# Patient Record
Sex: Female | Born: 1950
Health system: Southern US, Community
[De-identification: ages and names within clinical notes are randomized; demographics above are authoritative.]

## PROBLEM LIST (undated history)

## (undated) DIAGNOSIS — C801 Malignant (primary) neoplasm, unspecified: Secondary | ICD-10-CM

## (undated) DIAGNOSIS — J45909 Unspecified asthma, uncomplicated: Secondary | ICD-10-CM

## (undated) DIAGNOSIS — I10 Essential (primary) hypertension: Secondary | ICD-10-CM

## (undated) DIAGNOSIS — E039 Hypothyroidism, unspecified: Secondary | ICD-10-CM

## (undated) DIAGNOSIS — Z201 Contact with and (suspected) exposure to tuberculosis: Secondary | ICD-10-CM

## (undated) DIAGNOSIS — R7303 Prediabetes: Secondary | ICD-10-CM

## (undated) DIAGNOSIS — J9811 Atelectasis: Secondary | ICD-10-CM

## (undated) DIAGNOSIS — Z9109 Other allergy status, other than to drugs and biological substances: Secondary | ICD-10-CM

## (undated) DIAGNOSIS — E785 Hyperlipidemia, unspecified: Secondary | ICD-10-CM

## (undated) DIAGNOSIS — G629 Polyneuropathy, unspecified: Secondary | ICD-10-CM

## (undated) DIAGNOSIS — Z8489 Family history of other specified conditions: Secondary | ICD-10-CM

## (undated) DIAGNOSIS — R0600 Dyspnea, unspecified: Secondary | ICD-10-CM

## (undated) DIAGNOSIS — D492 Neoplasm of unspecified behavior of bone, soft tissue, and skin: Secondary | ICD-10-CM

## (undated) DIAGNOSIS — K219 Gastro-esophageal reflux disease without esophagitis: Secondary | ICD-10-CM

## (undated) DIAGNOSIS — Z889 Allergy status to unspecified drugs, medicaments and biological substances status: Secondary | ICD-10-CM

## (undated) HISTORY — PX: COLONOSCOPY W/ POLYPECTOMY: SHX1380

---

## 1997-07-21 HISTORY — PX: BREAST BIOPSY: SHX20

## 2001-02-01 ENCOUNTER — Emergency Department (HOSPITAL_COMMUNITY): Admission: EM | Admit: 2001-02-01 | Discharge: 2001-02-02 | Payer: Self-pay | Admitting: Emergency Medicine

## 2001-04-13 ENCOUNTER — Encounter: Payer: Self-pay | Admitting: General Surgery

## 2001-04-13 ENCOUNTER — Encounter: Admission: RE | Admit: 2001-04-13 | Discharge: 2001-04-13 | Payer: Self-pay | Admitting: General Surgery

## 2002-12-27 ENCOUNTER — Encounter: Admission: RE | Admit: 2002-12-27 | Discharge: 2002-12-27 | Payer: Self-pay | Admitting: Obstetrics and Gynecology

## 2002-12-27 ENCOUNTER — Encounter: Payer: Self-pay | Admitting: Obstetrics and Gynecology

## 2004-06-11 ENCOUNTER — Ambulatory Visit: Payer: Self-pay | Admitting: Obstetrics and Gynecology

## 2004-06-27 ENCOUNTER — Ambulatory Visit: Payer: Self-pay | Admitting: Obstetrics and Gynecology

## 2004-07-21 HISTORY — PX: CARPAL TUNNEL RELEASE: SHX101

## 2004-09-23 ENCOUNTER — Ambulatory Visit: Payer: Self-pay | Admitting: Internal Medicine

## 2005-10-07 ENCOUNTER — Ambulatory Visit: Payer: Self-pay | Admitting: Internal Medicine

## 2006-09-17 ENCOUNTER — Ambulatory Visit: Payer: Self-pay | Admitting: Family Medicine

## 2007-08-24 ENCOUNTER — Ambulatory Visit: Payer: Self-pay | Admitting: Internal Medicine

## 2008-07-21 DIAGNOSIS — C801 Malignant (primary) neoplasm, unspecified: Secondary | ICD-10-CM

## 2008-07-21 HISTORY — PX: OTHER SURGICAL HISTORY: SHX169

## 2008-07-21 HISTORY — DX: Malignant (primary) neoplasm, unspecified: C80.1

## 2008-08-01 ENCOUNTER — Ambulatory Visit: Payer: Self-pay | Admitting: Unknown Physician Specialty

## 2008-08-07 ENCOUNTER — Ambulatory Visit: Payer: Self-pay | Admitting: Unknown Physician Specialty

## 2009-07-16 ENCOUNTER — Ambulatory Visit: Payer: Self-pay

## 2009-12-26 ENCOUNTER — Ambulatory Visit: Payer: Self-pay | Admitting: General Practice

## 2009-12-26 ENCOUNTER — Ambulatory Visit: Payer: Self-pay | Admitting: Internal Medicine

## 2011-02-18 ENCOUNTER — Ambulatory Visit: Payer: Self-pay | Admitting: Internal Medicine

## 2011-04-17 ENCOUNTER — Ambulatory Visit: Payer: Self-pay | Admitting: Gastroenterology

## 2012-02-24 ENCOUNTER — Ambulatory Visit: Payer: Self-pay | Admitting: Internal Medicine

## 2012-09-30 ENCOUNTER — Ambulatory Visit: Payer: Self-pay | Admitting: General Practice

## 2012-10-19 ENCOUNTER — Ambulatory Visit: Payer: Self-pay | Admitting: General Practice

## 2014-02-22 ENCOUNTER — Ambulatory Visit: Payer: Self-pay | Admitting: Family Medicine

## 2015-07-04 ENCOUNTER — Other Ambulatory Visit: Payer: Self-pay | Admitting: Family Medicine

## 2015-07-04 DIAGNOSIS — Z1231 Encounter for screening mammogram for malignant neoplasm of breast: Secondary | ICD-10-CM

## 2015-07-11 ENCOUNTER — Ambulatory Visit
Admission: RE | Admit: 2015-07-11 | Discharge: 2015-07-11 | Disposition: A | Discharge status: Home / Self Care | Payer: No Typology Code available for payment source | Source: Ambulatory Visit | Attending: Family Medicine | Admitting: Family Medicine

## 2015-07-11 DIAGNOSIS — Z1231 Encounter for screening mammogram for malignant neoplasm of breast: Secondary | ICD-10-CM | POA: Diagnosis present

## 2015-07-11 HISTORY — DX: Malignant (primary) neoplasm, unspecified: C80.1

## 2016-02-27 ENCOUNTER — Other Ambulatory Visit: Payer: Self-pay | Admitting: Internal Medicine

## 2016-02-27 ENCOUNTER — Telehealth: Payer: Self-pay | Admitting: General Surgery

## 2016-02-27 DIAGNOSIS — E041 Nontoxic single thyroid nodule: Secondary | ICD-10-CM

## 2016-02-27 NOTE — Telephone Encounter (Signed)
Left voice message for patient to call and schedule appointment for right chest lesion. Referred by [place]

## 2016-02-28 ENCOUNTER — Other Ambulatory Visit: Payer: Self-pay | Admitting: Internal Medicine

## 2016-02-28 DIAGNOSIS — E041 Nontoxic single thyroid nodule: Secondary | ICD-10-CM

## 2016-02-29 ENCOUNTER — Telehealth: Payer: Self-pay | Admitting: Surgery

## 2016-02-29 NOTE — Telephone Encounter (Signed)
Spoke to patient regarding scheduling an appointment for BX right chest lesion. Referred by [facility]. She will be seeing her pcp 03/03/16 and wants to see what he says. She will call us if she decides she wants an appointment.

## 2016-03-04 ENCOUNTER — Ambulatory Visit: Payer: Medicare HMO

## 2016-03-20 ENCOUNTER — Telehealth: Payer: Self-pay | Admitting: Surgery

## 2016-03-20 ENCOUNTER — Encounter: Payer: Self-pay | Admitting: Surgery

## 2016-03-20 NOTE — Telephone Encounter (Signed)
Left voice message for patient to see if she was ready to schedule an appointment after seeing her PCP. Sent letter.

## 2016-05-06 ENCOUNTER — Other Ambulatory Visit: Payer: Self-pay | Admitting: Internal Medicine

## 2016-05-06 DIAGNOSIS — Z1231 Encounter for screening mammogram for malignant neoplasm of breast: Secondary | ICD-10-CM

## 2016-07-11 ENCOUNTER — Ambulatory Visit: Payer: No Typology Code available for payment source

## 2016-07-16 ENCOUNTER — Other Ambulatory Visit: Payer: Self-pay | Admitting: Internal Medicine

## 2016-07-16 DIAGNOSIS — R945 Abnormal results of liver function studies: Secondary | ICD-10-CM

## 2016-07-17 ENCOUNTER — Ambulatory Visit
Admission: RE | Admit: 2016-07-17 | Discharge: 2016-07-17 | Disposition: A | Discharge status: Home / Self Care | Payer: Medicare HMO | Source: Ambulatory Visit | Attending: Internal Medicine | Admitting: Internal Medicine

## 2016-07-17 DIAGNOSIS — Z1231 Encounter for screening mammogram for malignant neoplasm of breast: Secondary | ICD-10-CM | POA: Diagnosis not present

## 2016-07-18 ENCOUNTER — Ambulatory Visit
Admission: RE | Admit: 2016-07-18 | Discharge: 2016-07-18 | Disposition: A | Discharge status: Home / Self Care | Payer: Medicare HMO | Source: Ambulatory Visit | Attending: Internal Medicine | Admitting: Internal Medicine

## 2016-07-18 DIAGNOSIS — K8020 Calculus of gallbladder without cholecystitis without obstruction: Secondary | ICD-10-CM | POA: Diagnosis not present

## 2016-07-18 DIAGNOSIS — R945 Abnormal results of liver function studies: Secondary | ICD-10-CM

## 2016-07-18 DIAGNOSIS — R748 Abnormal levels of other serum enzymes: Secondary | ICD-10-CM | POA: Insufficient documentation

## 2017-05-21 ENCOUNTER — Other Ambulatory Visit: Payer: Self-pay | Admitting: Nurse Practitioner

## 2017-05-21 DIAGNOSIS — Z1231 Encounter for screening mammogram for malignant neoplasm of breast: Secondary | ICD-10-CM

## 2017-07-20 ENCOUNTER — Ambulatory Visit
Admission: RE | Admit: 2017-07-20 | Discharge: 2017-07-20 | Disposition: A | Discharge status: Home / Self Care | Payer: Medicare HMO | Source: Ambulatory Visit | Attending: Nurse Practitioner | Admitting: Nurse Practitioner

## 2017-07-20 DIAGNOSIS — Z1231 Encounter for screening mammogram for malignant neoplasm of breast: Secondary | ICD-10-CM | POA: Diagnosis present

## 2018-01-15 ENCOUNTER — Other Ambulatory Visit: Payer: Self-pay

## 2018-01-15 ENCOUNTER — Encounter
Admission: RE | Admit: 2018-01-15 | Discharge: 2018-01-15 | Disposition: A | Discharge status: Home / Self Care | Payer: Medicare PPO | Source: Ambulatory Visit | Attending: Surgery | Admitting: Surgery

## 2018-01-15 DIAGNOSIS — Z01810 Encounter for preprocedural cardiovascular examination: Secondary | ICD-10-CM | POA: Insufficient documentation

## 2018-01-15 DIAGNOSIS — Z01812 Encounter for preprocedural laboratory examination: Secondary | ICD-10-CM | POA: Insufficient documentation

## 2018-01-15 DIAGNOSIS — I10 Essential (primary) hypertension: Secondary | ICD-10-CM | POA: Diagnosis not present

## 2018-01-15 DIAGNOSIS — R9431 Abnormal electrocardiogram [ECG] [EKG]: Secondary | ICD-10-CM | POA: Diagnosis not present

## 2018-01-15 DIAGNOSIS — I517 Cardiomegaly: Secondary | ICD-10-CM | POA: Diagnosis not present

## 2018-01-15 HISTORY — DX: Hypothyroidism, unspecified: E03.9

## 2018-01-15 HISTORY — DX: Other allergy status, other than to drugs and biological substances: Z91.09

## 2018-01-15 HISTORY — DX: Essential (primary) hypertension: I10

## 2018-01-15 HISTORY — DX: Allergy status to unspecified drugs, medicaments and biological substances: Z88.9

## 2018-01-15 HISTORY — DX: Polyneuropathy, unspecified: G62.9

## 2018-01-15 HISTORY — DX: Gastro-esophageal reflux disease without esophagitis: K21.9

## 2018-01-15 HISTORY — DX: Hyperlipidemia, unspecified: E78.5

## 2018-01-15 HISTORY — DX: Family history of other specified conditions: Z84.89

## 2018-01-15 LAB — BASIC METABOLIC PANEL
Anion gap: 9 (ref 5–15)
BUN: 9 mg/dL (ref 8–23)
CO2: 29 mmol/L (ref 22–32)
Calcium: 9.6 mg/dL (ref 8.9–10.3)
Chloride: 102 mmol/L (ref 98–111)
Creatinine, Ser: 0.66 mg/dL (ref 0.44–1.00)
GFR calc Af Amer: >60 mL/min (ref >60)
GFR calc non Af Amer: >60 mL/min (ref >60)
GLUCOSE: 94 mg/dL (ref 70–99)
POTASSIUM: 3.3 mmol/L — AB (ref 3.5–5.1)
Sodium: 140 mmol/L (ref 135–145)

## 2018-01-15 LAB — CBC
HEMATOCRIT: 43.4 % (ref 35.0–47.0)
Hemoglobin: 14.7 g/dL (ref 12.0–16.0)
MCH: 28.4 pg (ref 26.0–34.0)
MCHC: 33.9 g/dL (ref 32.0–36.0)
MCV: 83.8 fL (ref 80.0–100.0)
Platelets: 260 10*3/uL (ref 150–440)
RBC: 5.17 MIL/uL (ref 3.80–5.20)
RDW: 14.5 % (ref 11.5–14.5)
WBC: 8.1 10*3/uL (ref 3.6–11.0)

## 2018-01-15 NOTE — Patient Instructions (Addendum)
Your procedure is scheduled on: Friday 01/22/18 Report to [place]. To find out your arrival time please call [phone] between 1PM - 3PM on [facility] 01/20/18.  Remember: Instructions that are not followed completely may result in serious medical risk, up to and including death, or upon the discretion of your surgeon and anesthesiologist your surgery may need to be rescheduled.     _X__ 1. Do not eat food after midnight the night before your procedure.                 No gum chewing or hard candies. You may drink clear liquids up to 2 hours                 before you are scheduled to arrive for your surgery- DO not drink clear                 liquids within 2 hours of the start of your surgery.                 Clear Liquids include:  water, apple juice without pulp, clear carbohydrate                 drink such as Clearfast or Gatorade, Black Coffee or Tea (Do not add                 anything to coffee or tea).  __X__2.  On the morning of surgery brush your teeth with toothpaste and water, you                 may rinse your mouth with mouthwash if you wish.  Do not swallow any              toothpaste of mouthwash.     _X__ 3.  No Alcohol for 24 hours before or after surgery.   _X__ 4.  Do Not Smoke or use e-cigarettes For 24 Hours Prior to Your Surgery.                 Do not use any chewable tobacco products for at least 6 hours prior to                 surgery.  ____  5.  Bring all medications with you on the day of surgery if instructed.   __X__  6.  Notify your doctor if there is any change in your medical condition      (cold, fever, infections).     Do not wear jewelry, make-up, hairpins, clips or nail polish. Do not wear lotions, powders, or perfumes.  Do not shave 48 hours prior to surgery. Men may shave face and neck. Do not bring valuables to the hospital.    [facility] is not responsible for any belongings or  valuables.  Contacts, dentures/partials or body piercings may not be worn into surgery. Bring a case for your contacts, glasses or hearing aids, a denture cup will be supplied. Leave your suitcase in the car. After surgery it may be brought to your room. For patients admitted to the hospital, discharge time is determined by your treatment team.   Patients discharged the day of surgery will not be allowed to drive home.   Please read over the following fact sheets that you were given:   MRSA Information  __X__ Take these medicines the morning of surgery with A SIP OF WATER:  1. CARVEDILOL  2. LEVOCETIRIZINE  3. OMEPRAZOLE  4. LEVOTHYROXINE  5.  6.  ____ Fleet Enema (as directed)   __X__ Use CHG Soap/SAGE wipes as directed  ____ Use inhalers on the day of surgery  ____ Stop metformin/Janumet/Farxiga 2 days prior to surgery    ____ Take 1/2 of usual insulin dose the night before surgery. No insulin the morning          of surgery.   ____ Stop Blood Thinners Coumadin/Plavix/Xarelto/Pleta/Pradaxa/Eliquis/Effient/Aspirin  on   Or contact your Surgeon, Cardiologist or Medical Doctor regarding  ability to stop your blood thinners  __X__ Stop Anti-inflammatories 7 days before surgery such as Advil, Ibuprofen, Motrin,  BC or Goodies Powder, Naprosyn, Naproxen, Aleve, Aspirin OK TO TAKE TYLENOL   __X__ Stop all herbal supplements, fish oil or vitamin E until after surgery.    ____ Bring C-Pap to the hospital.

## 2018-01-15 NOTE — Pre-Procedure Instructions (Signed)
   ECG 12-lead8/05/2015 [place] Component Name Value Ref Range  Vent Rate (bpm) 78   PR Interval (msec) 164   QRS Interval (msec) 104   QT Interval (msec) 398   QTc (msec) 453   Result Narrative  Normal sinus rhythm Left atrial enlargement Left ventricular hypertrophy Nonspecific ST elevation Nonspecific T wave abnormalities, Inferolateral leads Abnormal ECG  When compared with ECG of 01-Mar-2015 04:01, No significant change was found I reviewed and concur with this report. Electronically signed XH:[id number], MD, DOUGLAS [phone]) on 03/01/2015 5:39:01 PM

## 2018-01-18 DIAGNOSIS — D492 Neoplasm of unspecified behavior of bone, soft tissue, and skin: Secondary | ICD-10-CM

## 2018-01-18 HISTORY — DX: Neoplasm of unspecified behavior of bone, soft tissue, and skin: D49.2

## 2018-01-18 HISTORY — PX: MULTIPLE TOOTH EXTRACTIONS: SHX2053

## 2018-01-18 NOTE — Pre-Procedure Instructions (Signed)
FAXED KT 3.3 TO PCP AND FYI TO SURGEON

## 2018-02-16 ENCOUNTER — Other Ambulatory Visit: Payer: Self-pay

## 2018-02-16 ENCOUNTER — Encounter: Payer: Self-pay | Admitting: *Deleted

## 2018-02-16 ENCOUNTER — Encounter
Admission: RE | Admit: 2018-02-16 | Discharge: 2018-02-16 | Disposition: A | Discharge status: Home / Self Care | Payer: Medicare PPO | Source: Ambulatory Visit | Attending: Surgery | Admitting: Surgery

## 2018-02-16 NOTE — Pre-Procedure Instructions (Signed)
Called the office of Dr. Ceasar Mons to request results of Echo, stress test and ABI performed earlier this month. Unable to get fax through (rings as busy).  Office stated that they would have to wait to receive a fax before they can send to [facility]

## 2018-02-17 NOTE — Pre-Procedure Instructions (Signed)
Cardiac clearance/stress/echo on chart

## 2018-02-25 NOTE — H&P (Signed)
Ashlee Miller Location: [place] Office Patient #: [id number] DOB: 15-Jun-1951 Married / Language: English / Race: Black or African American Female   History of Present Illness  The patient is a 67 year old female who presents with a complaint of Mass. She has developed this mass over her right clavicle over the last several years. It is getting larger in size. She would like to have it removed. Initially she was reluctant to consider [place] since she had a sister who died before she had surgery probably from an anesthetic complication. I reassured her and I think it would be fine to do her under general anesthesia.  I explained how we would do this. It may be that we want to try to inject this with Kenalog to try to cut down on the potential for a keloid. We will go ahead and schedule this under general anesthesia.   Past Surgical History  Oral Surgery   Diagnostic Studies History  Colonoscopy  1-5 years ago Mammogram  within last year Pap Smear  1-5 years ago  Allergies ( ACE Inhibitors  Penicillins  Fenofibrate *ANTIHYPERLIPIDEMICS*   Medication History  Klor-Con M20 (20MEQ Tablet ER, Oral) Active. Atorvastatin Calcium (80MG  Tablet, Oral) Active. Carvedilol (12.5MG  Tablet, Oral) Active. Gabapentin (100MG  Capsule, Oral) Active. HydroCHLOROthiazide (25MG  Tablet, Oral) Active. Losartan Potassium (100MG  Tablet, Oral) Active. Omeprazole (40MG  Capsule DR, Oral) Active. Montelukast Sodium (10MG  Tablet, Oral) Active. Allegra Allergy (180MG  Tablet, Oral) Active. Flonase Allergy Relief (50MCG/ACT Suspension, Nasal) Active. Aspirin (81MG  Tablet, Oral) Active. Advair Diskus (250-50MCG/DOSE Aero Pow Br Act, Inhalation) Active. Multivitamin (Oral) Active. ProAir HFA (108 (90 Base)MCG/ACT Aerosol Soln, Inhalation) Active. Medications Reconciled  Social History  Caffeine use  Coffee, Tea. No alcohol use  No drug use  Tobacco  use  Former smoker.  Family History Anesthetic complications  Mother, Sister. Arthritis  Mother. Colon Polyps  Brother. Diabetes Mellitus  Brother, Mother, Sister. Heart Disease  Father. Hypertension  Brother, Mother, Sister.  Pregnancy / Birth History  Age at menarche  27 years. Age of menopause  28-55 Gravida  0  Other Problems  High blood pressure  Hypercholesterolemia  Thyroid Disease     Review of Systems General Not Present- Appetite Loss, Chills, Fatigue, Fever, Night Sweats, Weight Gain and Weight Loss. Skin Present- Hives. Not Present- Change in Wart/Mole, Dryness, Jaundice, New Lesions, Non-Healing Wounds, Rash and Ulcer. HEENT Present- Seasonal Allergies. Not Present- Earache, Hearing Loss, Hoarseness, Nose Bleed, Oral Ulcers, Ringing in the Ears, Sinus Pain, Sore Throat, Visual Disturbances, Wears glasses/contact lenses and Yellow Eyes. Respiratory Not Present- Bloody sputum, Chronic Cough, Difficulty Breathing, Snoring and Wheezing. Breast Not Present- Breast Mass, Breast Pain, Nipple Discharge and Skin Changes. Cardiovascular Present- Swelling of Extremities. Not Present- Chest Pain, Difficulty Breathing Lying Down, Leg Cramps, Palpitations, Rapid Heart Rate and Shortness of Breath. Gastrointestinal Not Present- Abdominal Pain, Bloating, Bloody Stool, Change in Bowel Habits, Chronic diarrhea, Constipation, Difficulty Swallowing, Excessive gas, Gets full quickly at meals, Hemorrhoids, Indigestion, Nausea, Rectal Pain and Vomiting. Female Genitourinary Not Present- Frequency, Nocturia, Painful Urination, Pelvic Pain and Urgency. Musculoskeletal Not Present- Back Pain, Joint Pain, Joint Stiffness, Muscle Pain, Muscle Weakness and Swelling of Extremities. Neurological Present- Trouble walking. Not Present- Decreased Memory, Fainting, Headaches, Numbness, Seizures, Tingling, Tremor and Weakness. Psychiatric Not Present- Anxiety, Bipolar, Change in  Sleep Pattern, Depression, Fearful and Frequent crying. Endocrine Present- Cold Intolerance. Not Present- Excessive Hunger, Hair Changes, Heat Intolerance, Hot flashes and New Diabetes. Hematology Not Present- Blood Thinners, Easy Bruising,  Excessive bleeding, Gland problems, HIV and Persistent Infections.   Physical Exam  WDWNAAF NAD HEENT unremarkable Heart SR Chest and Lung Exam Note: at the level of the right clavicle there is a soft freely movable mass consistent with a lipoma. 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   Matt B. Hassell Done, MD, FACS

## 2018-02-26 ENCOUNTER — Ambulatory Visit: Payer: Medicare PPO | Admitting: Anesthesiology

## 2018-02-26 ENCOUNTER — Other Ambulatory Visit: Payer: Self-pay

## 2018-02-26 ENCOUNTER — Ambulatory Visit
Admission: RE | Admit: 2018-02-26 | Discharge: 2018-02-26 | Disposition: A | Discharge status: Home / Self Care | Payer: Medicare PPO | Source: Ambulatory Visit | Attending: Surgery | Admitting: Surgery

## 2018-02-26 ENCOUNTER — Encounter
Admission: RE | Disposition: A | Discharge status: Home / Self Care | Payer: Self-pay | Source: Ambulatory Visit | Attending: Surgery

## 2018-02-26 ENCOUNTER — Encounter: Payer: Self-pay | Admitting: Anesthesiology

## 2018-02-26 DIAGNOSIS — Z888 Allergy status to other drugs, medicaments and biological substances status: Secondary | ICD-10-CM | POA: Insufficient documentation

## 2018-02-26 DIAGNOSIS — Z7982 Long term (current) use of aspirin: Secondary | ICD-10-CM | POA: Diagnosis not present

## 2018-02-26 DIAGNOSIS — Z880 Allergy status to penicillin: Secondary | ICD-10-CM | POA: Insufficient documentation

## 2018-02-26 DIAGNOSIS — Z87891 Personal history of nicotine dependence: Secondary | ICD-10-CM | POA: Insufficient documentation

## 2018-02-26 DIAGNOSIS — D171 Benign lipomatous neoplasm of skin and subcutaneous tissue of trunk: Secondary | ICD-10-CM | POA: Diagnosis not present

## 2018-02-26 DIAGNOSIS — Z79890 Hormone replacement therapy (postmenopausal): Secondary | ICD-10-CM | POA: Diagnosis not present

## 2018-02-26 DIAGNOSIS — Z79899 Other long term (current) drug therapy: Secondary | ICD-10-CM | POA: Insufficient documentation

## 2018-02-26 DIAGNOSIS — E039 Hypothyroidism, unspecified: Secondary | ICD-10-CM | POA: Diagnosis not present

## 2018-02-26 DIAGNOSIS — I10 Essential (primary) hypertension: Secondary | ICD-10-CM | POA: Diagnosis not present

## 2018-02-26 DIAGNOSIS — K219 Gastro-esophageal reflux disease without esophagitis: Secondary | ICD-10-CM | POA: Diagnosis not present

## 2018-02-26 DIAGNOSIS — J45909 Unspecified asthma, uncomplicated: Secondary | ICD-10-CM | POA: Diagnosis not present

## 2018-02-26 HISTORY — PX: MASS EXCISION: SHX2000

## 2018-02-26 HISTORY — DX: Unspecified asthma, uncomplicated: J45.909

## 2018-02-26 HISTORY — DX: Neoplasm of unspecified behavior of bone, soft tissue, and skin: D49.2

## 2018-02-26 SURGERY — EXCISION MASS
Anesthesia: General | Laterality: Right | Wound class: Clean

## 2018-02-26 MED ORDER — CHLORHEXIDINE GLUCONATE CLOTH 2 % EX PADS: 6.0000 | MEDICATED_PAD | Freq: Once | CUTANEOUS | Status: DC

## 2018-02-26 MED ORDER — ONDANSETRON HCL 4 MG/2ML IJ SOLN
INTRAMUSCULAR | Status: AC
  Filled 2018-02-26: qty 2

## 2018-02-26 MED ORDER — FENTANYL CITRATE (PF) 100 MCG/2ML IJ SOLN: 25.0000 ug | INTRAMUSCULAR | Status: DC | PRN

## 2018-02-26 MED ORDER — PROPOFOL 10 MG/ML IV BOLUS
INTRAVENOUS | Status: DC | PRN
  Administered 2018-02-26: 160 mg via INTRAVENOUS

## 2018-02-26 MED ORDER — DEXAMETHASONE SODIUM PHOSPHATE 10 MG/ML IJ SOLN
INTRAMUSCULAR | Status: DC | PRN
  Administered 2018-02-26: 10 mg via INTRAVENOUS

## 2018-02-26 MED ORDER — DEXAMETHASONE SODIUM PHOSPHATE 10 MG/ML IJ SOLN
INTRAMUSCULAR | Status: AC
  Filled 2018-02-26: qty 1

## 2018-02-26 MED ORDER — SUGAMMADEX SODIUM 200 MG/2ML IV SOLN
INTRAVENOUS | Status: DC | PRN
  Administered 2018-02-26: 200 mg via INTRAVENOUS

## 2018-02-26 MED ORDER — HEPARIN SODIUM (PORCINE) 5000 UNIT/ML IJ SOLN
5000.0000 [IU] | Freq: Once | INTRAMUSCULAR | Status: AC
  Administered 2018-02-26: 5000 [IU] via SUBCUTANEOUS

## 2018-02-26 MED ORDER — ROCURONIUM BROMIDE 50 MG/5ML IV SOLN
INTRAVENOUS | Status: AC
  Filled 2018-02-26: qty 1

## 2018-02-26 MED ORDER — LACTATED RINGERS IV SOLN
INTRAVENOUS | Status: DC
  Administered 2018-02-26: 09:00:00 via INTRAVENOUS

## 2018-02-26 MED ORDER — ONDANSETRON HCL 4 MG/2ML IJ SOLN: 4.0000 mg | Freq: Once | INTRAMUSCULAR | Status: DC | PRN

## 2018-02-26 MED ORDER — FENTANYL CITRATE (PF) 100 MCG/2ML IJ SOLN
INTRAMUSCULAR | Status: DC | PRN
  Administered 2018-02-26 (×2): 50 ug via INTRAVENOUS

## 2018-02-26 MED ORDER — CELECOXIB 200 MG PO CAPS
ORAL_CAPSULE | ORAL | Status: AC
  Administered 2018-02-26: 200 mg via ORAL
  Filled 2018-02-26: qty 1

## 2018-02-26 MED ORDER — FENTANYL CITRATE (PF) 100 MCG/2ML IJ SOLN
INTRAMUSCULAR | Status: AC
  Filled 2018-02-26: qty 2

## 2018-02-26 MED ORDER — HEPARIN SODIUM (PORCINE) 5000 UNIT/ML IJ SOLN
INTRAMUSCULAR | Status: AC
  Administered 2018-02-26: 5000 [IU] via SUBCUTANEOUS
  Filled 2018-02-26: qty 1

## 2018-02-26 MED ORDER — PHENYLEPHRINE HCL 10 MG/ML IJ SOLN
INTRAMUSCULAR | Status: DC | PRN
  Administered 2018-02-26: 100 ug via INTRAVENOUS
  Administered 2018-02-26: 150 ug via INTRAVENOUS
  Administered 2018-02-26: 100 ug via INTRAVENOUS

## 2018-02-26 MED ORDER — EPHEDRINE SULFATE 50 MG/ML IJ SOLN
INTRAMUSCULAR | Status: DC | PRN
  Administered 2018-02-26: 20 mg via INTRAVENOUS

## 2018-02-26 MED ORDER — ACETAMINOPHEN 500 MG PO TABS
ORAL_TABLET | ORAL | Status: AC
  Administered 2018-02-26: 1000 mg via ORAL
  Filled 2018-02-26: qty 2

## 2018-02-26 MED ORDER — GABAPENTIN 300 MG PO CAPS
300.0000 mg | ORAL_CAPSULE | ORAL | Status: AC
  Administered 2018-02-26: 300 mg via ORAL

## 2018-02-26 MED ORDER — ROCURONIUM BROMIDE 100 MG/10ML IV SOLN
INTRAVENOUS | Status: DC | PRN
  Administered 2018-02-26: 10 mg via INTRAVENOUS

## 2018-02-26 MED ORDER — TRIAMCINOLONE ACETONIDE 40 MG/ML IJ SUSP
INTRAMUSCULAR | Status: DC | PRN
  Administered 2018-02-26: 40 mg

## 2018-02-26 MED ORDER — CELECOXIB 200 MG PO CAPS
200.0000 mg | ORAL_CAPSULE | ORAL | Status: AC
  Administered 2018-02-26: 200 mg via ORAL

## 2018-02-26 MED ORDER — SUCCINYLCHOLINE CHLORIDE 20 MG/ML IJ SOLN
INTRAMUSCULAR | Status: AC
  Filled 2018-02-26: qty 1

## 2018-02-26 MED ORDER — GABAPENTIN 300 MG PO CAPS
ORAL_CAPSULE | ORAL | Status: AC
  Administered 2018-02-26: 300 mg via ORAL
  Filled 2018-02-26: qty 1

## 2018-02-26 MED ORDER — SUCCINYLCHOLINE CHLORIDE 20 MG/ML IJ SOLN
INTRAMUSCULAR | Status: DC | PRN
  Administered 2018-02-26: 120 mg via INTRAVENOUS

## 2018-02-26 MED ORDER — BUPIVACAINE-EPINEPHRINE (PF) 0.5% -1:200000 IJ SOLN
INTRAMUSCULAR | Status: AC
  Filled 2018-02-26: qty 30

## 2018-02-26 MED ORDER — ONDANSETRON HCL 4 MG/2ML IJ SOLN
INTRAMUSCULAR | Status: DC | PRN
  Administered 2018-02-26: 4 mg via INTRAVENOUS

## 2018-02-26 MED ORDER — MIDAZOLAM HCL 2 MG/2ML IJ SOLN
INTRAMUSCULAR | Status: DC | PRN
  Administered 2018-02-26: 2 mg via INTRAVENOUS

## 2018-02-26 MED ORDER — BUPIVACAINE LIPOSOME 1.3 % IJ SUSP
20.0000 mL | Freq: Once | INTRAMUSCULAR | Status: DC
  Filled 2018-02-26: qty 20

## 2018-02-26 MED ORDER — PROPOFOL 10 MG/ML IV BOLUS
INTRAVENOUS | Status: AC
  Filled 2018-02-26: qty 20

## 2018-02-26 MED ORDER — SUGAMMADEX SODIUM 200 MG/2ML IV SOLN
INTRAVENOUS | Status: AC
  Filled 2018-02-26: qty 2

## 2018-02-26 MED ORDER — ACETAMINOPHEN 500 MG PO TABS
1000.0000 mg | ORAL_TABLET | ORAL | Status: AC
  Administered 2018-02-26: 1000 mg via ORAL

## 2018-02-26 MED ORDER — MIDAZOLAM HCL 2 MG/2ML IJ SOLN
INTRAMUSCULAR | Status: AC
  Filled 2018-02-26: qty 2

## 2018-02-26 MED ORDER — TRIAMCINOLONE ACETONIDE 40 MG/ML IJ SUSP
INTRAMUSCULAR | Status: AC
  Filled 2018-02-26: qty 1

## 2018-02-26 MED ORDER — CEFAZOLIN SODIUM-DEXTROSE 2-4 GM/100ML-% IV SOLN
2.0000 g | INTRAVENOUS | Status: AC
  Administered 2018-02-26: 2 g via INTRAVENOUS

## 2018-02-26 MED ORDER — BUPIVACAINE LIPOSOME 1.3 % IJ SUSP
INTRAMUSCULAR | Status: AC
  Filled 2018-02-26: qty 20

## 2018-02-26 MED ORDER — LIDOCAINE HCL (CARDIAC) PF 100 MG/5ML IV SOSY
PREFILLED_SYRINGE | INTRAVENOUS | Status: DC | PRN
  Administered 2018-02-26: 100 mg via INTRAVENOUS

## 2018-02-26 MED ORDER — BUPIVACAINE-EPINEPHRINE 0.5% -1:200000 IJ SOLN
INTRAMUSCULAR | Status: DC | PRN
  Administered 2018-02-26: 19 mL

## 2018-02-26 MED ORDER — HYDROCODONE-ACETAMINOPHEN 5-325 MG PO TABS: 1.0000 | ORAL_TABLET | Freq: Four times a day (QID) | ORAL | 0 refills | Status: DC | PRN

## 2018-02-26 MED ORDER — LIDOCAINE HCL (PF) 2 % IJ SOLN
INTRAMUSCULAR | Status: AC
  Filled 2018-02-26: qty 10

## 2018-02-26 MED ORDER — CEFAZOLIN SODIUM-DEXTROSE 2-4 GM/100ML-% IV SOLN
INTRAVENOUS | Status: AC
  Filled 2018-02-26: qty 100

## 2018-02-26 SURGICAL SUPPLY — 16 items
CANISTER SUCT 1200ML W/VALVE (MISCELLANEOUS) ×3 IMPLANT
DERMABOND ADVANCED (GAUZE/BANDAGES/DRESSINGS) ×2
DERMABOND ADVANCED .7 DNX12 (GAUZE/BANDAGES/DRESSINGS) ×1 IMPLANT
DRAPE LAPAROTOMY 77X122 PED (DRAPES) ×3 IMPLANT
DRSG GAUZE FLUFF 36X18 (GAUZE/BANDAGES/DRESSINGS) ×3 IMPLANT
ELECT REM PT RETURN 9FT ADLT (ELECTROSURGICAL) ×3
ELECTRODE REM PT RTRN 9FT ADLT (ELECTROSURGICAL) ×1 IMPLANT
GLOVE BIO SURGEON STRL SZ8 (GLOVE) ×3 IMPLANT
GOWN STRL REUS W/ TWL LRG LVL3 (GOWN DISPOSABLE) ×2 IMPLANT
GOWN STRL REUS W/TWL LRG LVL3 (GOWN DISPOSABLE) ×4
KIT TURNOVER KIT A (KITS) ×3 IMPLANT
NEEDLE HYPO 25GX1X1/2 BEV (NEEDLE) ×3 IMPLANT
SUT MNCRL 3 0 RB1 (SUTURE) ×1 IMPLANT
SUT MONOCRYL 3 0 RB1 (SUTURE) ×2
SUT VIC AB PLUS 45CM 1-MO-4 (SUTURE) ×3 IMPLANT
SYR BULB IRRIG 60ML STRL (SYRINGE) ×3 IMPLANT

## 2018-02-26 NOTE — Anesthesia Post-op Follow-up Note (Signed)
Anesthesia QCDR form completed.        

## 2018-02-26 NOTE — Discharge Instructions (Addendum)
Lipoma Removal, Care After Refer to this sheet in the next few weeks. These instructions provide you with information about caring for yourself after your procedure. Your health care provider may also give you more specific instructions. Your treatment has been planned according to current medical practices, but problems sometimes occur. Call your health care provider if you have any problems or questions after your procedure. What can I expect after the procedure? After the procedure, it is common to have:  Mild pain.  Swelling.  Bruising.  Follow these instructions at home:  Bathing You may shower tomorrow  Incision care   Follow instructions from your health care provider about how to take care of your incision. Make sure you: ? Wash your hands with soap and water before you change your bandage (dressing). If soap and water are not available, use hand sanitizer. ? Change your dressing as told by your health care provider. ? Leave stitches (sutures), skin glue, or adhesive strips in place. These skin closures may need to stay in place for 2 weeks or longer. If adhesive strip edges start to loosen and curl up, you may trim the loose edges. Do not remove adhesive strips completely unless your health care provider tells you to do that.  Check your incision area every day for signs of infection. Check for: ? More redness, swelling, or pain. ? Fluid or blood. ? Warmth. ? Pus or a bad smell. Driving  Do not drive or operate heavy machinery while taking prescription pain medicine.  Do not drive for 24 hours if you received a medicine to help you relax (sedative) during your procedure.  Ask your health care provider when it is safe for you to drive. General instructions  Take over-the-counter and prescription medicines only as told by your health care provider.  Do not use any tobacco products, such as cigarettes, chewing tobacco, and e-cigarettes. These can delay healing. If you  need help quitting, ask your health care provider.  Return to your normal activities as told by your health care provider. Ask your health care provider what activities are safe for you.  Keep all follow-up visits as told by your health care provider. This is important. Contact a health care provider if:  You have more redness, swelling, or pain around your incision.  You have fluid or blood coming from your incision.  Your incision feels warm to the touch.  You have pus or a bad smell coming from your incision.  You have pain that does not get better with medicine. Get help right away if:  You have chills or a fever.  You have severe pain. This information is not intended to replace advice given to you by your health care provider. Make sure you discuss any questions you have with your health care provider. Document Released: 09/20/2015 Document Revised: 12/18/2015 Document Reviewed: 09/20/2015 Elsevier Interactive Patient Education  2018 [place]   1) The drugs that you were given will stay in your system until tomorrow so for the next 24 hours you should not:  A) Drive an automobile B) Make any legal decisions C) Drink any alcoholic beverage   2) You may resume regular meals tomorrow.  Today it is better to start with liquids and gradually work up to solid foods.  You may eat anything you prefer, but it is better to start with liquids, then soup and crackers, and gradually work up to solid foods.  3) Please notify your doctor immediately if you have any unusual bleeding, trouble breathing, redness and pain at the surgery site, drainage, fever, or pain not relieved by medication.    4) Additional Instructions:        Please contact your physician with any problems or Same Day Surgery at [phone], Monday through Friday 6 am to 4 pm, or [place] at [facility] number at [phone].

## 2018-02-26 NOTE — Interval H&P Note (Signed)
History and Physical Interval Note:  02/26/2018 10:13 AM  Ashlee Miller  has presented today for surgery, with the diagnosis of Probable Lipoma  The various methods of treatment have been discussed with the patient and family. After consideration of risks, benefits and other options for treatment, the patient has consented to  right clavicle mass removal as a surgical intervention .  The patient's history has been reviewed, patient examined, no change in status, stable for surgery.  I have reviewed the patient's chart and labs.  Questions were answered to the patient's satisfaction.     Pedro Earls

## 2018-02-26 NOTE — Transfer of Care (Signed)
Immediate Anesthesia Transfer of Care Note  Patient: Ashlee Miller  Procedure(s) Performed: EXCISION OF MASS OF RIGHT CLAVICLE AREA (Right )  Patient Location: PACU  Anesthesia Type:General  Level of Consciousness: drowsy  Airway & Oxygen Therapy: Patient Spontanous Breathing and Patient connected to face mask oxygen  Post-op Assessment: Report given to RN and Post -op Vital signs reviewed and stable  Post vital signs: Reviewed and stable  Last Vitals:  Vitals Value Taken Time  BP 114/60 02/26/2018 11:19 AM  Temp 36.4 C 02/26/2018 11:19 AM  Pulse 75 02/26/2018 11:20 AM  Resp 19 02/26/2018 11:20 AM  SpO2 99 % 02/26/2018 11:20 AM  Vitals shown include unvalidated device data.  Last Pain:  Vitals:   02/26/18 1119  TempSrc:   PainSc: Asleep      Patients Stated Pain Goal: 1 (43/27/61 4709)  Complications: No apparent anesthesia complications

## 2018-02-26 NOTE — Anesthesia Preprocedure Evaluation (Signed)
Anesthesia Evaluation  Patient identified by MRN, date of birth, ID band Patient awake    Reviewed: Allergy & Precautions, NPO status , Patient's Chart, lab work & pertinent test results  History of Anesthesia Complications (+) Family history of anesthesia reaction  Airway Mallampati: III       Dental   Pulmonary asthma , former smoker,    Pulmonary exam normal        Cardiovascular hypertension, Normal cardiovascular exam     Neuro/Psych negative neurological ROS  negative psych ROS   GI/Hepatic Neg liver ROS, GERD  ,  Endo/Other  Hypothyroidism   Renal/GU   negative genitourinary   Musculoskeletal negative musculoskeletal ROS (+)   Abdominal Normal abdominal exam  (+)   Peds negative pediatric ROS (+)  Hematology negative hematology ROS (+)   Anesthesia Other Findings   Reproductive/Obstetrics                             Anesthesia Physical Anesthesia Plan  ASA: II  Anesthesia Plan: General   Post-op Pain Management:    Induction: Intravenous  PONV Risk Score and Plan:   Airway Management Planned: Oral ETT  Additional Equipment:   Intra-op Plan:   Post-operative Plan: Extubation in OR  Informed Consent: I have reviewed the patients History and Physical, chart, labs and discussed the procedure including the risks, benefits and alternatives for the proposed anesthesia with the patient or authorized representative who has indicated his/her understanding and acceptance.   Dental advisory given  Plan Discussed with: CRNA and Surgeon  Anesthesia Plan Comments:         Anesthesia Quick Evaluation

## 2018-02-26 NOTE — Op Note (Signed)
Ashlee Miller  25-Aug-1950 February 26, 2018    PCP:  Danelle Berry, NP   Surgeon: Kaylyn Lim, MD, FACS  Asst:  none  Anes:  General by LMA  Preop Dx: 5 cm lipoma of the right clavicle  Postop Dx: same  Procedure: Excision of lipoma of the right clavicle area Location Surgery: [facility] #3 Complications: none  EBL:   minimal cc  Drains: none  Description of Procedure:  The patient was taken to OR 3 .  After anesthesia was administered and the patient was prepped  with Cholroprep and a timeout was performed.  The mass on and below the right clavicle had been marked and was very visible.  Proposed incision was infiltrated with 0.5% marcaine.  The incision was made and was taken down in to the fatty mass.  It was very defined and was dissected out en toto and removed.   Kenalog was infiltrated into the skin.  Minimal bleeding controlled with Bovie.  Closure with 4-0 vicryl and 5-0 Monocryl.  Closure with Dermabond.    The patient tolerated the procedure well and was taken to the PACU in stable condition.     Matt B. Hassell Done, [place], [facility], [place]

## 2018-02-26 NOTE — Anesthesia Procedure Notes (Signed)
Procedure Name: Intubation Date/Time: 02/26/2018 10:32 AM Performed by: Eben Burow, CRNA Pre-anesthesia Checklist: Patient identified, Emergency Drugs available, Suction available, Patient being monitored and Timeout performed Patient Re-evaluated:Patient Re-evaluated prior to induction Oxygen Delivery Method: Circle system utilized Preoxygenation: Pre-oxygenation with 100% oxygen Induction Type: IV induction Ventilation: Mask ventilation without difficulty Laryngoscope Size: Mac and 3 Grade View: Grade I Tube type: Oral Tube size: 7.0 mm Number of attempts: 1 Airway Equipment and Method: Stylet and LTA kit utilized Placement Confirmation: ETT inserted through vocal cords under direct vision,  positive ETCO2 and breath sounds checked- equal and bilateral Secured at: 21 cm Tube secured with: Tape Dental Injury: Teeth and Oropharynx as per pre-operative assessment

## 2018-03-01 NOTE — Anesthesia Postprocedure Evaluation (Signed)
Anesthesia Post Note  Patient: Ashlee Miller  Procedure(s) Performed: EXCISION OF MASS OF RIGHT CLAVICLE AREA (Right )  Patient location during evaluation: PACU Anesthesia Type: General Level of consciousness: awake and alert and oriented Pain management: pain level controlled Vital Signs Assessment: post-procedure vital signs reviewed and stable Respiratory status: spontaneous breathing Cardiovascular status: blood pressure returned to baseline Anesthetic complications: no     Last Vitals:  Vitals:   02/26/18 1204 02/26/18 1209  BP: 133/80 135/71  Pulse: 77 77  Resp: 18 20  Temp: (!) 36.3 C 36.6 C  SpO2: 97% 97%    Last Pain:  Vitals:   03/01/18 0813  TempSrc:   PainSc: 0-No pain                 [address]

## 2018-03-02 LAB — SURGICAL PATHOLOGY

## 2018-03-09 LAB — POCT I-STAT 4, (NA,K, GLUC, HGB,HCT)
GLUCOSE: 100 mg/dL — AB (ref 70–99)
HEMATOCRIT: 41 % (ref 36.0–46.0)
Hemoglobin: 13.9 g/dL (ref 12.0–15.0)
Potassium: 3.3 mmol/L — ABNORMAL LOW (ref 3.5–5.1)
SODIUM: 141 mmol/L (ref 135–145)

## 2018-07-19 ENCOUNTER — Other Ambulatory Visit: Payer: Self-pay | Admitting: Nurse Practitioner

## 2018-07-19 DIAGNOSIS — Z1231 Encounter for screening mammogram for malignant neoplasm of breast: Secondary | ICD-10-CM

## 2018-08-06 ENCOUNTER — Ambulatory Visit
Admission: RE | Admit: 2018-08-06 | Discharge: 2018-08-06 | Disposition: A | Discharge status: Home / Self Care | Payer: Medicare PPO | Source: Ambulatory Visit | Attending: Nurse Practitioner | Admitting: Nurse Practitioner

## 2018-08-06 DIAGNOSIS — Z1231 Encounter for screening mammogram for malignant neoplasm of breast: Secondary | ICD-10-CM | POA: Insufficient documentation

## 2019-08-31 ENCOUNTER — Other Ambulatory Visit: Payer: Self-pay | Admitting: Nurse Practitioner

## 2019-08-31 DIAGNOSIS — Z1231 Encounter for screening mammogram for malignant neoplasm of breast: Secondary | ICD-10-CM

## 2019-09-27 ENCOUNTER — Ambulatory Visit
Admission: RE | Admit: 2019-09-27 | Discharge: 2019-09-27 | Disposition: A | Discharge status: Home / Self Care | Payer: Medicare PPO | Source: Ambulatory Visit | Attending: Nurse Practitioner | Admitting: Nurse Practitioner

## 2019-09-27 DIAGNOSIS — Z1231 Encounter for screening mammogram for malignant neoplasm of breast: Secondary | ICD-10-CM | POA: Insufficient documentation

## 2020-09-18 ENCOUNTER — Other Ambulatory Visit: Payer: Self-pay | Admitting: Nurse Practitioner

## 2020-09-18 DIAGNOSIS — Z1231 Encounter for screening mammogram for malignant neoplasm of breast: Secondary | ICD-10-CM

## 2020-10-31 ENCOUNTER — Ambulatory Visit
Admission: RE | Admit: 2020-10-31 | Discharge: 2020-10-31 | Disposition: A | Discharge status: Home / Self Care | Payer: Medicare HMO | Source: Ambulatory Visit | Attending: Nurse Practitioner | Admitting: Nurse Practitioner

## 2020-10-31 ENCOUNTER — Other Ambulatory Visit: Payer: Self-pay

## 2020-10-31 DIAGNOSIS — Z1231 Encounter for screening mammogram for malignant neoplasm of breast: Secondary | ICD-10-CM | POA: Diagnosis not present

## 2021-01-07 DIAGNOSIS — E785 Hyperlipidemia, unspecified: Secondary | ICD-10-CM | POA: Diagnosis not present

## 2021-01-07 DIAGNOSIS — E119 Type 2 diabetes mellitus without complications: Secondary | ICD-10-CM | POA: Diagnosis not present

## 2021-01-07 DIAGNOSIS — E039 Hypothyroidism, unspecified: Secondary | ICD-10-CM | POA: Diagnosis not present

## 2021-01-07 DIAGNOSIS — I10 Essential (primary) hypertension: Secondary | ICD-10-CM | POA: Diagnosis not present

## 2021-01-17 DIAGNOSIS — Z6841 Body Mass Index (BMI) 40.0 and over, adult: Secondary | ICD-10-CM | POA: Diagnosis not present

## 2021-01-17 DIAGNOSIS — J329 Chronic sinusitis, unspecified: Secondary | ICD-10-CM | POA: Diagnosis not present

## 2021-01-17 DIAGNOSIS — M109 Gout, unspecified: Secondary | ICD-10-CM | POA: Diagnosis not present

## 2021-01-17 DIAGNOSIS — E785 Hyperlipidemia, unspecified: Secondary | ICD-10-CM | POA: Diagnosis not present

## 2021-01-17 DIAGNOSIS — J069 Acute upper respiratory infection, unspecified: Secondary | ICD-10-CM | POA: Diagnosis not present

## 2021-01-17 DIAGNOSIS — E119 Type 2 diabetes mellitus without complications: Secondary | ICD-10-CM | POA: Diagnosis not present

## 2021-01-17 DIAGNOSIS — I10 Essential (primary) hypertension: Secondary | ICD-10-CM | POA: Diagnosis not present

## 2021-01-17 DIAGNOSIS — G629 Polyneuropathy, unspecified: Secondary | ICD-10-CM | POA: Diagnosis not present

## 2021-02-01 ENCOUNTER — Telehealth: Payer: Self-pay

## 2021-02-01 NOTE — Telephone Encounter (Signed)
I called and advised Ashlee Miller that our office is not accepting new patients at this time.

## 2021-02-01 NOTE — Telephone Encounter (Signed)
Copied from [place] [id number]. Topic: Appointment Scheduling - Scheduling Inquiry for Clinic >> Jan 31, 2021 12:30 PM Oneta Rack wrote: Reason for CRM: Patient seeking a NPA with the new provider Tally Joe, NP. Patient would like to schedule appointment as soon as possible due to a sinus infection. Patient took a home COVID test which came back negative. Patient would like a follow up call

## 2021-03-26 DIAGNOSIS — R7309 Other abnormal glucose: Secondary | ICD-10-CM | POA: Diagnosis not present

## 2021-03-26 DIAGNOSIS — Z85819 Personal history of malignant neoplasm of unspecified site of lip, oral cavity, and pharynx: Secondary | ICD-10-CM | POA: Diagnosis not present

## 2021-03-26 DIAGNOSIS — I10 Essential (primary) hypertension: Secondary | ICD-10-CM | POA: Diagnosis not present

## 2021-03-26 DIAGNOSIS — E039 Hypothyroidism, unspecified: Secondary | ICD-10-CM | POA: Diagnosis not present

## 2021-03-26 DIAGNOSIS — Z0000 Encounter for general adult medical examination without abnormal findings: Secondary | ICD-10-CM | POA: Diagnosis not present

## 2021-05-14 DIAGNOSIS — Z23 Encounter for immunization: Secondary | ICD-10-CM | POA: Diagnosis not present

## 2021-05-14 DIAGNOSIS — G609 Hereditary and idiopathic neuropathy, unspecified: Secondary | ICD-10-CM | POA: Diagnosis not present

## 2021-05-14 DIAGNOSIS — R7309 Other abnormal glucose: Secondary | ICD-10-CM | POA: Diagnosis not present

## 2021-05-14 DIAGNOSIS — I10 Essential (primary) hypertension: Secondary | ICD-10-CM | POA: Diagnosis not present

## 2021-05-14 DIAGNOSIS — Z0130 Encounter for examination of blood pressure without abnormal findings: Secondary | ICD-10-CM | POA: Diagnosis not present

## 2021-05-14 DIAGNOSIS — E782 Mixed hyperlipidemia: Secondary | ICD-10-CM | POA: Diagnosis not present

## 2021-05-14 DIAGNOSIS — Z124 Encounter for screening for malignant neoplasm of cervix: Secondary | ICD-10-CM | POA: Diagnosis not present

## 2021-05-14 DIAGNOSIS — Z01419 Encounter for gynecological examination (general) (routine) without abnormal findings: Secondary | ICD-10-CM | POA: Diagnosis not present

## 2021-05-14 DIAGNOSIS — Z0000 Encounter for general adult medical examination without abnormal findings: Secondary | ICD-10-CM | POA: Diagnosis not present

## 2021-05-14 DIAGNOSIS — Z1389 Encounter for screening for other disorder: Secondary | ICD-10-CM | POA: Diagnosis not present

## 2021-06-05 DIAGNOSIS — R200 Anesthesia of skin: Secondary | ICD-10-CM | POA: Diagnosis not present

## 2021-06-05 DIAGNOSIS — R202 Paresthesia of skin: Secondary | ICD-10-CM | POA: Diagnosis not present

## 2021-06-05 DIAGNOSIS — R2689 Other abnormalities of gait and mobility: Secondary | ICD-10-CM | POA: Diagnosis not present

## 2021-06-05 DIAGNOSIS — R278 Other lack of coordination: Secondary | ICD-10-CM | POA: Diagnosis not present

## 2021-06-05 DIAGNOSIS — G629 Polyneuropathy, unspecified: Secondary | ICD-10-CM | POA: Diagnosis not present

## 2021-07-19 ENCOUNTER — Ambulatory Visit
Admission: RE | Admit: 2021-07-19 | Discharge: 2021-07-19 | Disposition: A | Discharge status: Home / Self Care | Payer: Medicare HMO | Attending: Internal Medicine | Admitting: Internal Medicine

## 2021-07-19 ENCOUNTER — Ambulatory Visit
Admission: RE | Admit: 2021-07-19 | Discharge: 2021-07-19 | Disposition: A | Discharge status: Home / Self Care | Payer: Medicare HMO | Source: Ambulatory Visit | Attending: Family Medicine | Admitting: Family Medicine

## 2021-07-19 ENCOUNTER — Other Ambulatory Visit: Payer: Self-pay | Admitting: Family Medicine

## 2021-07-19 DIAGNOSIS — K5900 Constipation, unspecified: Secondary | ICD-10-CM | POA: Diagnosis not present

## 2021-07-19 DIAGNOSIS — K219 Gastro-esophageal reflux disease without esophagitis: Secondary | ICD-10-CM | POA: Diagnosis not present

## 2021-07-19 DIAGNOSIS — Z0000 Encounter for general adult medical examination without abnormal findings: Secondary | ICD-10-CM | POA: Diagnosis not present

## 2021-07-19 DIAGNOSIS — H547 Unspecified visual loss: Secondary | ICD-10-CM | POA: Diagnosis not present

## 2021-07-19 DIAGNOSIS — Z0130 Encounter for examination of blood pressure without abnormal findings: Secondary | ICD-10-CM | POA: Diagnosis not present

## 2021-08-12 DIAGNOSIS — H524 Presbyopia: Secondary | ICD-10-CM | POA: Diagnosis not present

## 2021-08-12 DIAGNOSIS — H25813 Combined forms of age-related cataract, bilateral: Secondary | ICD-10-CM | POA: Diagnosis not present

## 2021-08-19 DIAGNOSIS — Z0130 Encounter for examination of blood pressure without abnormal findings: Secondary | ICD-10-CM | POA: Diagnosis not present

## 2021-08-19 DIAGNOSIS — G609 Hereditary and idiopathic neuropathy, unspecified: Secondary | ICD-10-CM | POA: Diagnosis not present

## 2021-08-19 DIAGNOSIS — E782 Mixed hyperlipidemia: Secondary | ICD-10-CM | POA: Diagnosis not present

## 2021-08-19 DIAGNOSIS — Z6841 Body Mass Index (BMI) 40.0 and over, adult: Secondary | ICD-10-CM | POA: Diagnosis not present

## 2021-08-19 DIAGNOSIS — I10 Essential (primary) hypertension: Secondary | ICD-10-CM | POA: Diagnosis not present

## 2021-08-19 DIAGNOSIS — Z0000 Encounter for general adult medical examination without abnormal findings: Secondary | ICD-10-CM | POA: Diagnosis not present

## 2021-08-19 DIAGNOSIS — R7309 Other abnormal glucose: Secondary | ICD-10-CM | POA: Diagnosis not present

## 2021-08-19 DIAGNOSIS — E039 Hypothyroidism, unspecified: Secondary | ICD-10-CM | POA: Diagnosis not present

## 2021-09-02 DIAGNOSIS — Z1211 Encounter for screening for malignant neoplasm of colon: Secondary | ICD-10-CM | POA: Diagnosis not present

## 2021-09-02 DIAGNOSIS — R7309 Other abnormal glucose: Secondary | ICD-10-CM | POA: Diagnosis not present

## 2021-09-02 DIAGNOSIS — I10 Essential (primary) hypertension: Secondary | ICD-10-CM | POA: Diagnosis not present

## 2021-09-02 DIAGNOSIS — E039 Hypothyroidism, unspecified: Secondary | ICD-10-CM | POA: Diagnosis not present

## 2021-09-02 DIAGNOSIS — E782 Mixed hyperlipidemia: Secondary | ICD-10-CM | POA: Diagnosis not present

## 2021-12-25 IMAGING — MG MM DIGITAL SCREENING BILAT W/ TOMO AND CAD
6 of 12 series · 6 of 36 positions shown · non-contrast
Comparison: Previous exam(s).

CLINICAL DATA: Screening.

EXAM:
DIGITAL SCREENING BILATERAL MAMMOGRAM WITH TOMOSYNTHESIS AND CAD
TECHNIQUE: Bilateral screening digital craniocaudal and mediolateral oblique
mammograms were obtained. Bilateral screening digital breast
tomosynthesis was performed. The images were evaluated with
computer-aided detection.

[R CC synth-2D]
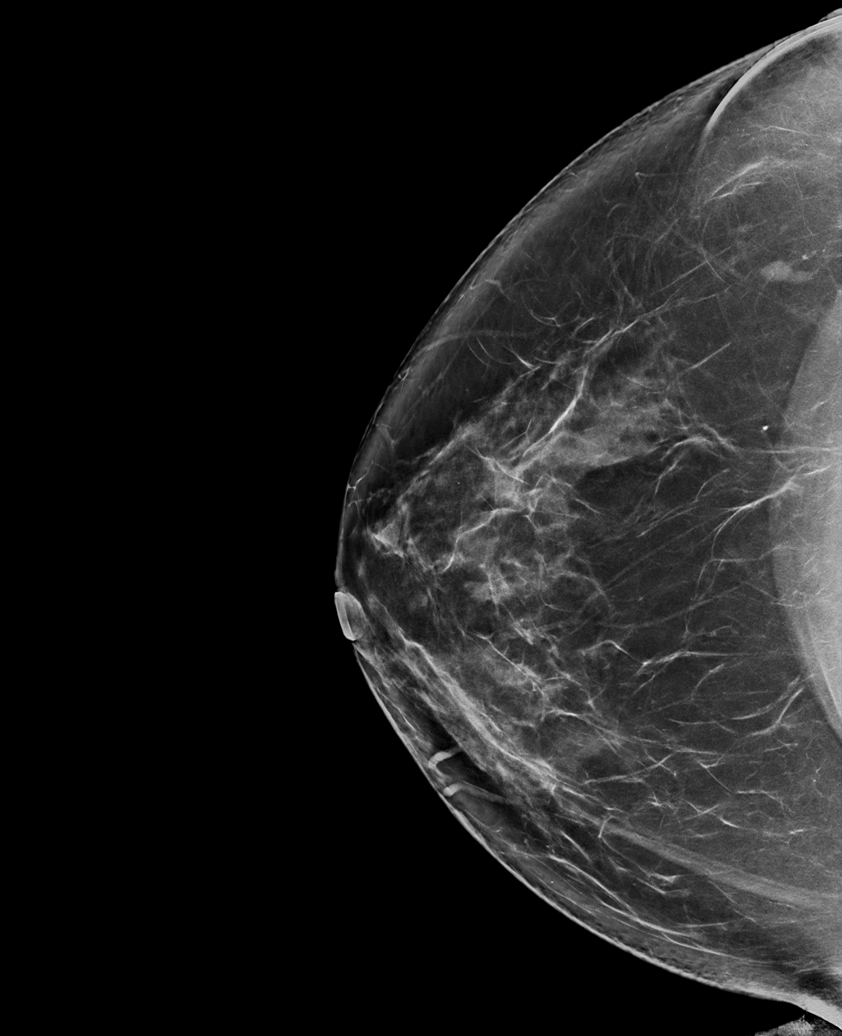

[L CC synth-2D]
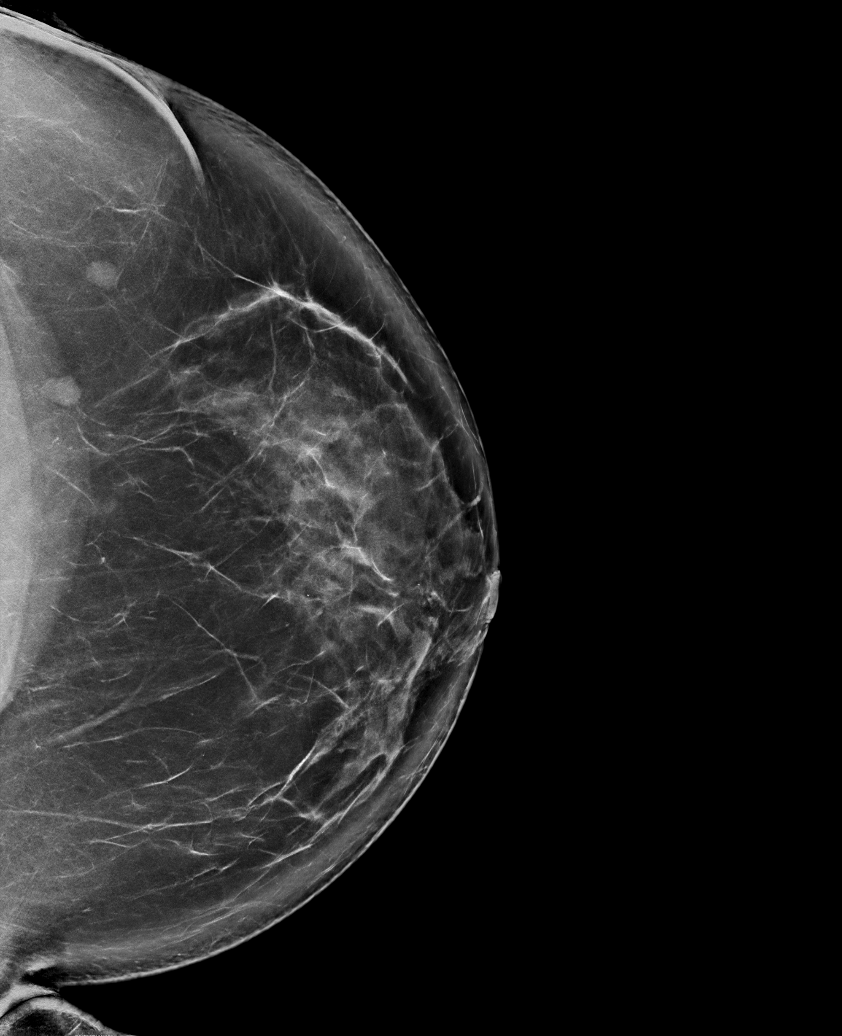

[R MLO synth-2D (1 of 2)]
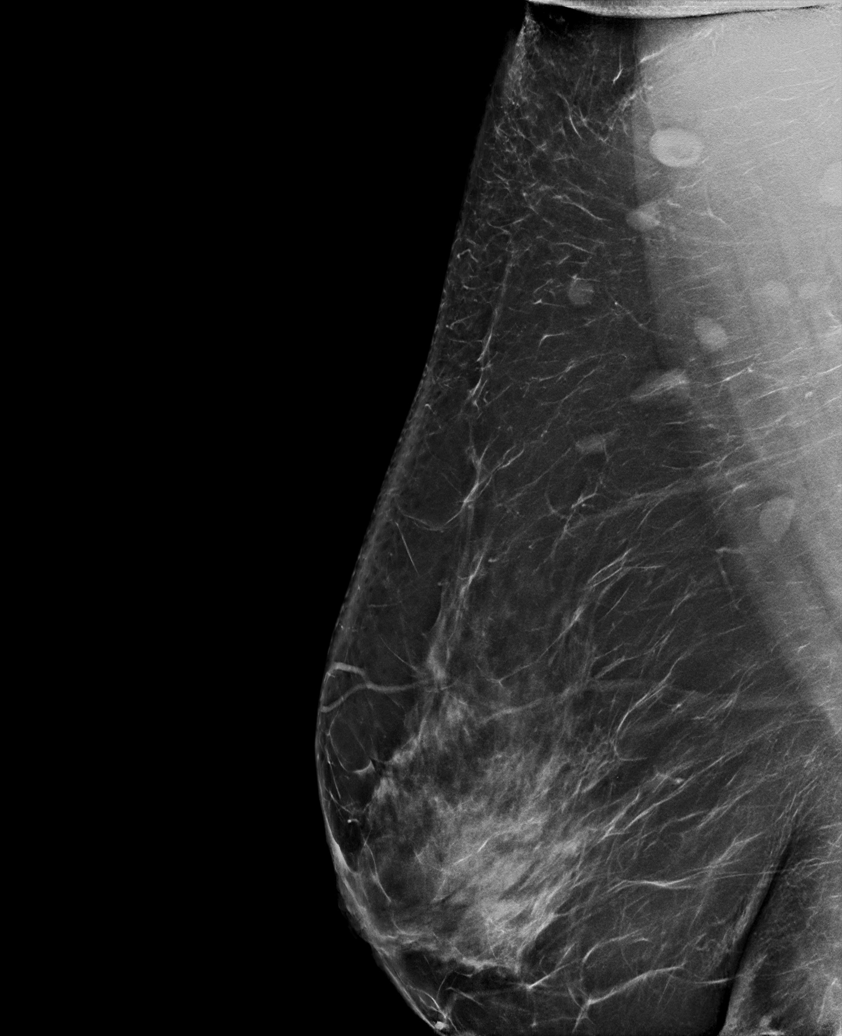

[L MLO synth-2D]
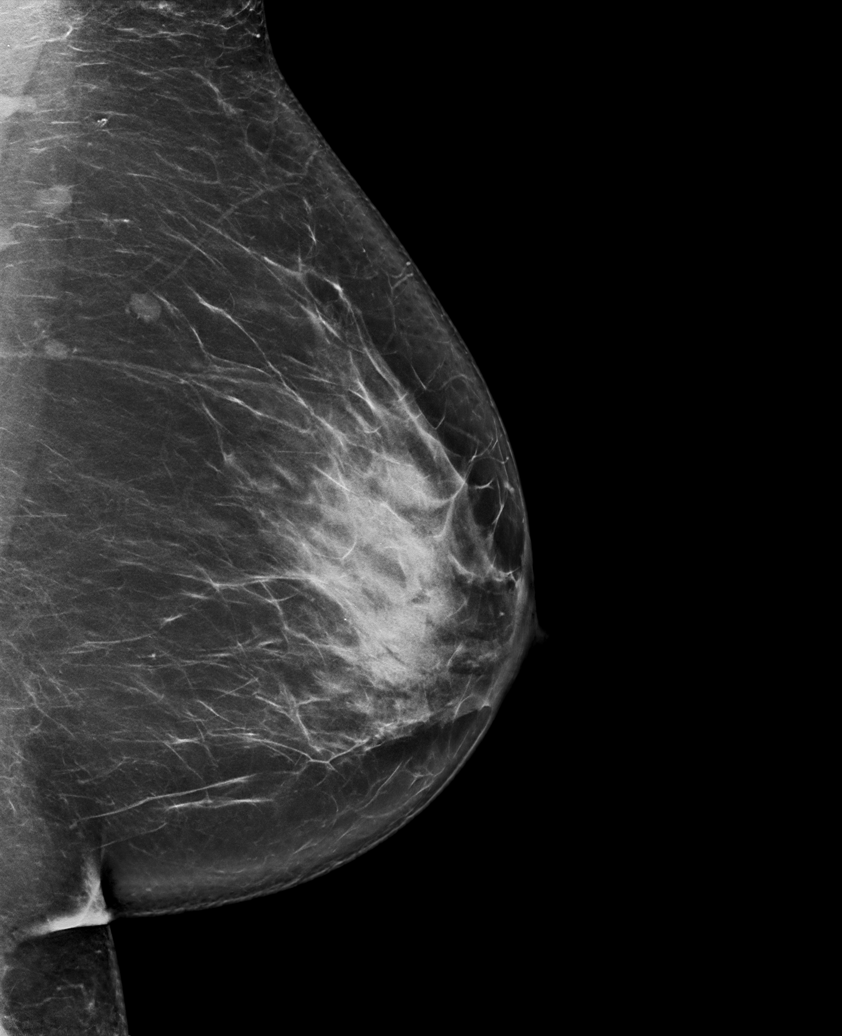

[L AT synth-2D]
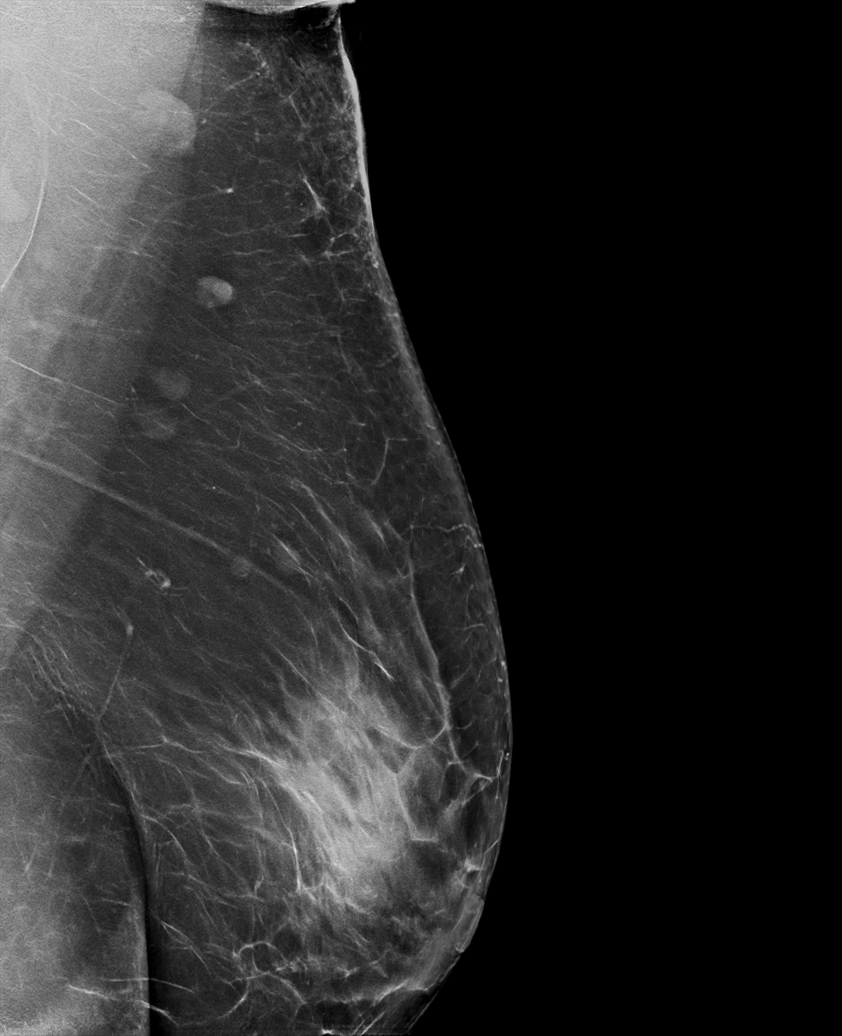

[R MLO synth-2D (2 of 2)]
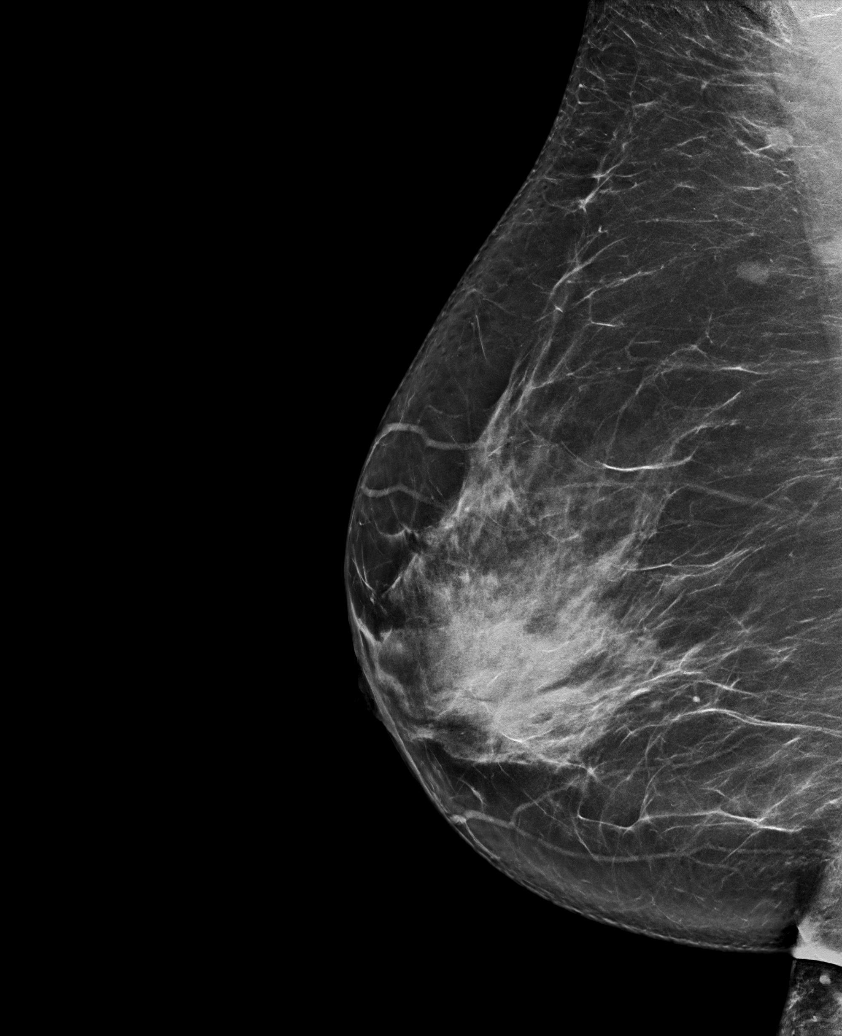

[6 of 36 positions shown; findings below may reference images not displayed]

ACR Breast Density Category c: The breast tissue is heterogeneously
dense, which may obscure small masses.
FINDINGS: There are no findings suspicious for malignancy. The images were
evaluated with computer-aided detection.
IMPRESSION: No mammographic evidence of malignancy. A result letter of this
screening mammogram will be mailed directly to the patient.

RECOMMENDATION:
Screening mammogram in one year. (Code:T4-5-GWO)

BI-RADS CATEGORY  1: Negative.

## 2022-03-13 ENCOUNTER — Other Ambulatory Visit: Payer: Self-pay | Admitting: Family Medicine

## 2022-03-13 ENCOUNTER — Other Ambulatory Visit: Payer: Self-pay | Admitting: Nurse Practitioner

## 2022-03-13 DIAGNOSIS — Z1231 Encounter for screening mammogram for malignant neoplasm of breast: Secondary | ICD-10-CM

## 2022-04-03 ENCOUNTER — Ambulatory Visit
Admission: RE | Admit: 2022-04-03 | Discharge: 2022-04-03 | Disposition: A | Discharge status: Home / Self Care | Payer: Medicare HMO | Source: Ambulatory Visit | Attending: Family Medicine | Admitting: Family Medicine

## 2022-04-03 DIAGNOSIS — Z1231 Encounter for screening mammogram for malignant neoplasm of breast: Secondary | ICD-10-CM | POA: Diagnosis present

## 2022-07-23 ENCOUNTER — Inpatient Hospital Stay: Payer: Medicare HMO | Attending: Oncology | Admitting: Oncology

## 2022-07-23 ENCOUNTER — Inpatient Hospital Stay: Payer: Medicare HMO

## 2022-09-12 IMAGING — CR DG ABDOMEN 1V
1 series · 2 of 2 positions shown · non-contrast
Comparison: None.

CLINICAL DATA: Constipation.

EXAM:
ABDOMEN - 1 VIEW

[Series 1: dg abd 1 view · 0.14mm/px · 2 of 2 slices shown]
[im 1/2]
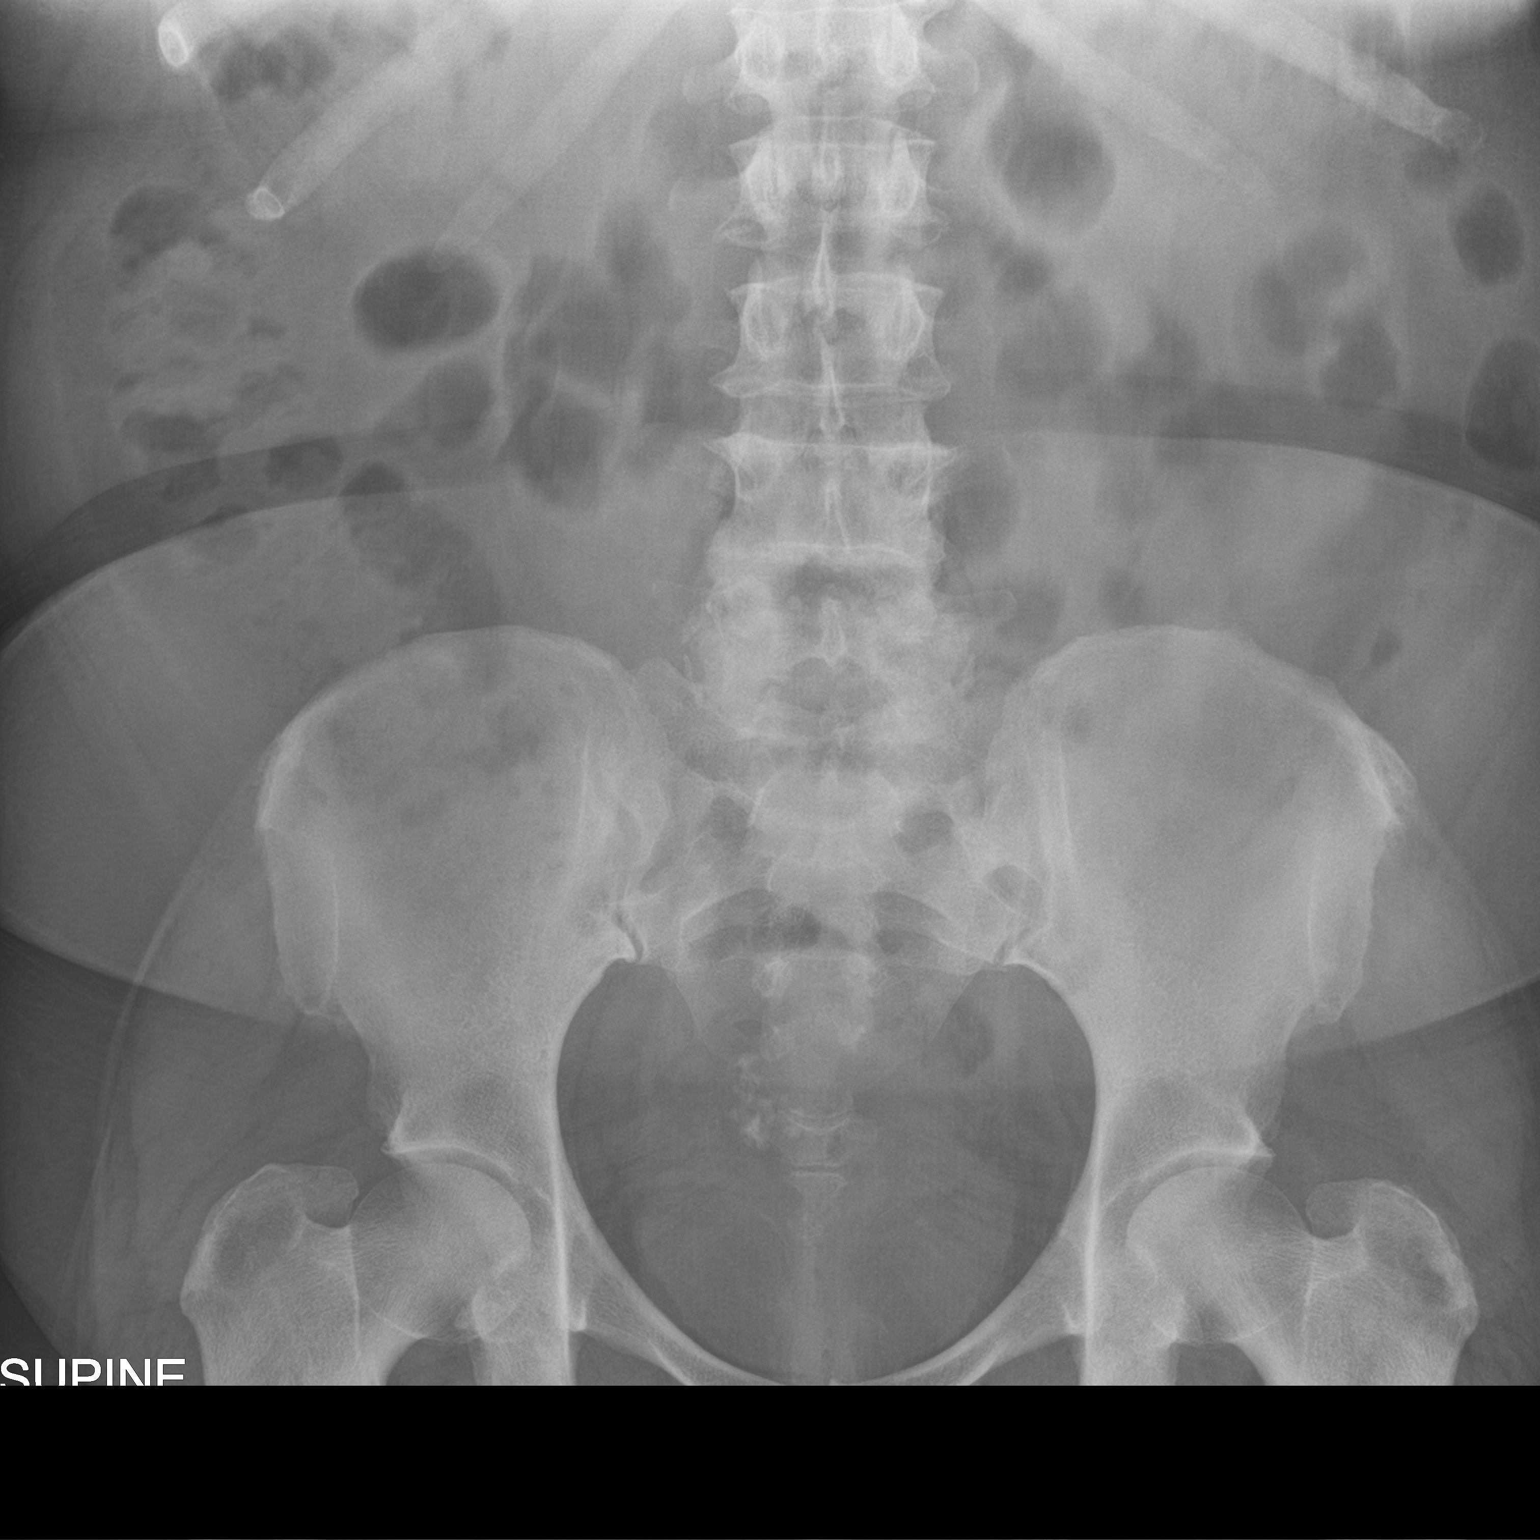
[im 2/2]
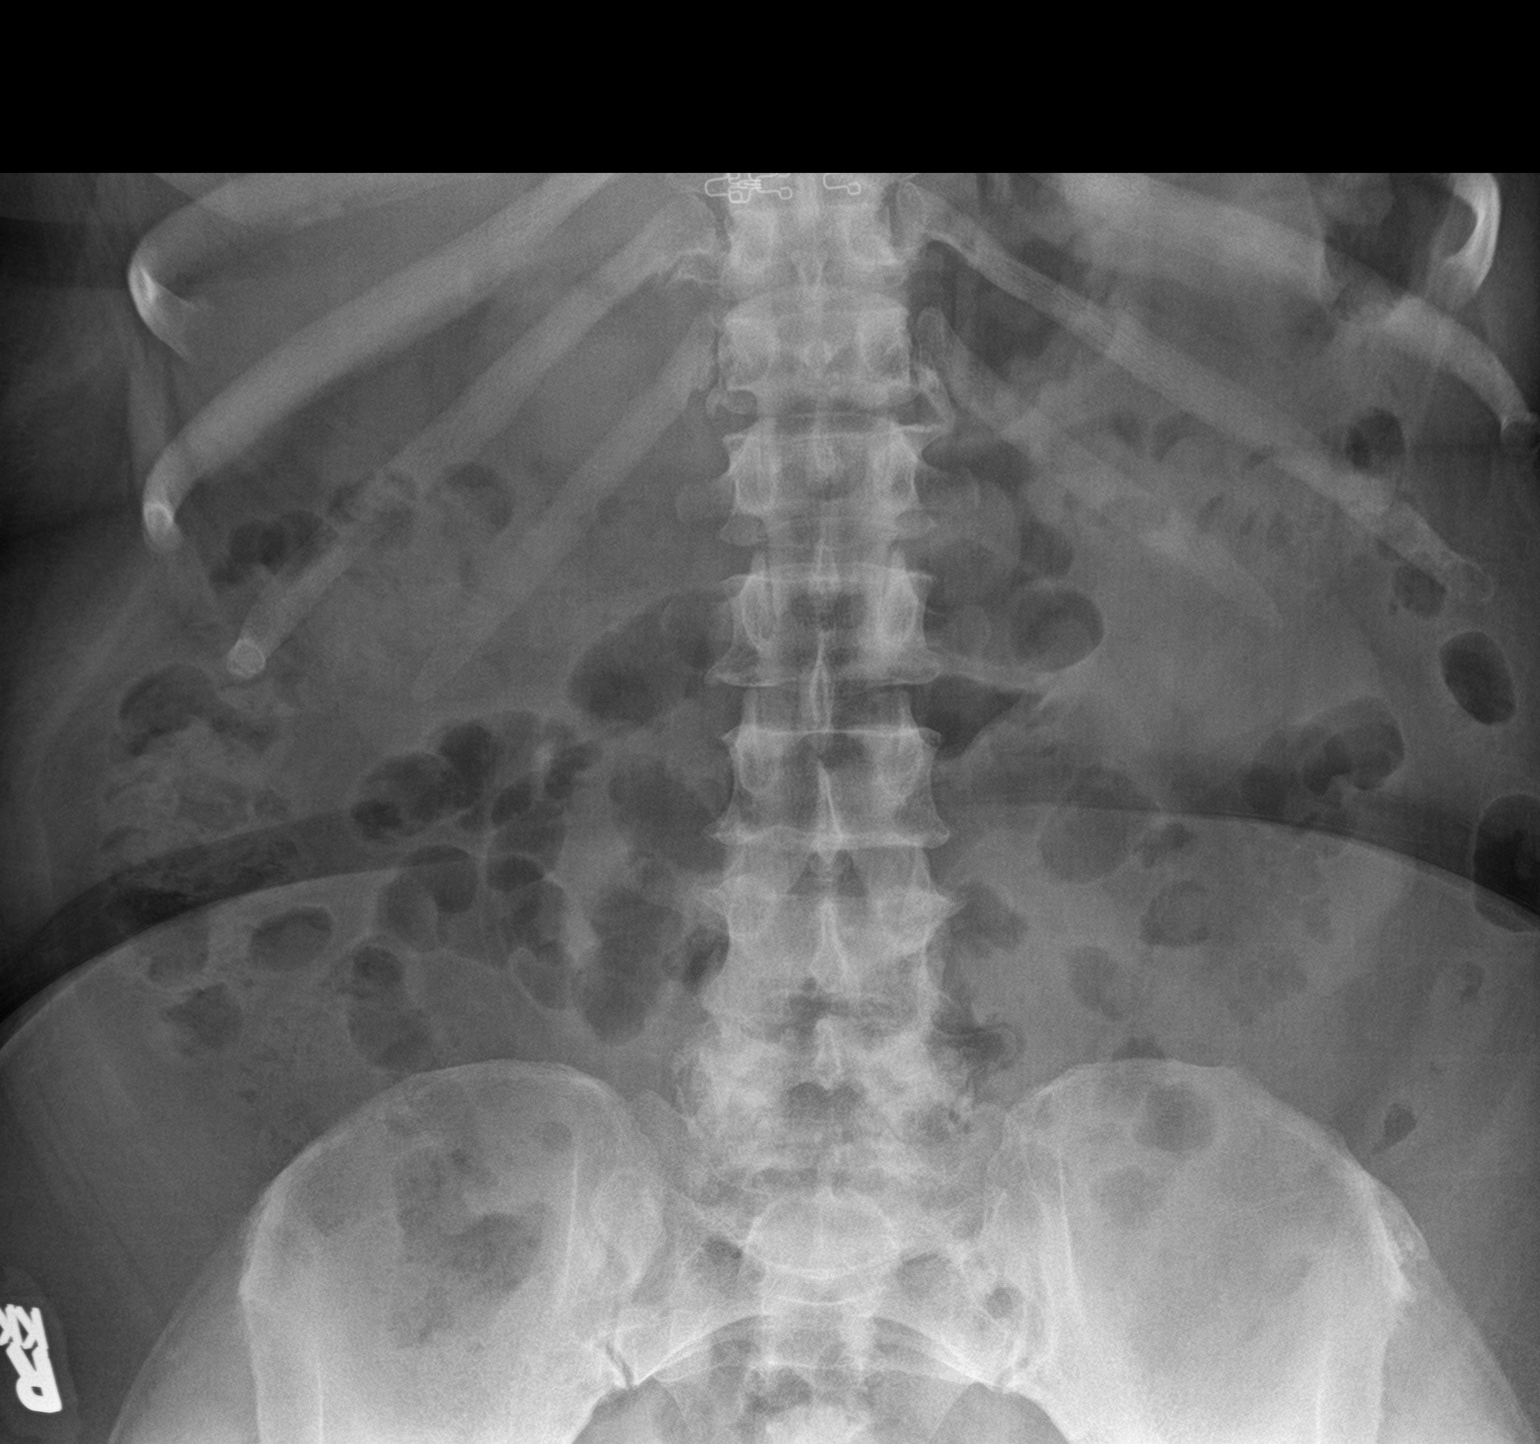

[2 of 2 positions shown; findings below may reference images not displayed]

FINDINGS: Fecal burden appears to be within normal limits. No bowel
obstruction. No other bony or soft tissue abnormalities identified.
IMPRESSION: The fecal burden in the colon appears to be within normal limits. No
other abnormalities.

## 2022-12-05 ENCOUNTER — Other Ambulatory Visit: Payer: Self-pay | Admitting: Family Medicine

## 2022-12-05 ENCOUNTER — Ambulatory Visit
Admission: RE | Admit: 2022-12-05 | Discharge: 2022-12-05 | Disposition: A | Discharge status: Home / Self Care | Payer: Medicare HMO | Source: Ambulatory Visit | Attending: Family Medicine | Admitting: Family Medicine

## 2022-12-05 DIAGNOSIS — J441 Chronic obstructive pulmonary disease with (acute) exacerbation: Secondary | ICD-10-CM

## 2023-03-12 ENCOUNTER — Other Ambulatory Visit: Payer: Self-pay | Admitting: Family Medicine

## 2023-03-12 DIAGNOSIS — R748 Abnormal levels of other serum enzymes: Secondary | ICD-10-CM

## 2023-04-03 LAB — COLOGUARD: COLOGUARD: NEGATIVE

## 2023-11-30 ENCOUNTER — Other Ambulatory Visit: Payer: Self-pay | Admitting: Student

## 2023-11-30 ENCOUNTER — Other Ambulatory Visit
Admission: RE | Admit: 2023-11-30 | Discharge: 2023-11-30 | Disposition: A | Discharge status: Home / Self Care | Source: Ambulatory Visit | Attending: Emergency Medicine | Admitting: Emergency Medicine

## 2023-11-30 DIAGNOSIS — J4489 Other specified chronic obstructive pulmonary disease: Secondary | ICD-10-CM | POA: Diagnosis present

## 2023-11-30 DIAGNOSIS — R0600 Dyspnea, unspecified: Secondary | ICD-10-CM

## 2023-11-30 DIAGNOSIS — Z87891 Personal history of nicotine dependence: Secondary | ICD-10-CM

## 2023-11-30 DIAGNOSIS — R053 Chronic cough: Secondary | ICD-10-CM

## 2023-11-30 DIAGNOSIS — R9389 Abnormal findings on diagnostic imaging of other specified body structures: Secondary | ICD-10-CM

## 2023-11-30 LAB — D-DIMER, QUANTITATIVE: D-Dimer, Quant: 0.54 ug{FEU}/mL — ABNORMAL HIGH (ref 0.00–0.50)

## 2023-12-02 ENCOUNTER — Ambulatory Visit
Admission: RE | Admit: 2023-12-02 | Discharge: 2023-12-02 | Disposition: A | Discharge status: Home / Self Care | Source: Ambulatory Visit | Attending: Emergency Medicine | Admitting: Emergency Medicine

## 2023-12-02 ENCOUNTER — Ambulatory Visit

## 2023-12-02 ENCOUNTER — Other Ambulatory Visit: Payer: Self-pay | Admitting: Emergency Medicine

## 2023-12-02 DIAGNOSIS — R7989 Other specified abnormal findings of blood chemistry: Secondary | ICD-10-CM

## 2023-12-02 DIAGNOSIS — R0602 Shortness of breath: Secondary | ICD-10-CM

## 2023-12-02 MED ORDER — IOHEXOL 350 MG/ML SOLN
75.0000 mL | Freq: Once | INTRAVENOUS | Status: AC | PRN
  Administered 2023-12-02: 75 mL via INTRAVENOUS

## 2023-12-04 ENCOUNTER — Other Ambulatory Visit: Payer: Self-pay | Admitting: Pulmonary Disease

## 2023-12-08 ENCOUNTER — Ambulatory Visit

## 2023-12-10 ENCOUNTER — Other Ambulatory Visit: Payer: Self-pay

## 2023-12-10 ENCOUNTER — Encounter
Admission: RE | Admit: 2023-12-10 | Discharge: 2023-12-10 | Disposition: A | Discharge status: Home / Self Care | Source: Ambulatory Visit | Attending: Pulmonary Disease | Admitting: Pulmonary Disease

## 2023-12-10 DIAGNOSIS — R7303 Prediabetes: Secondary | ICD-10-CM

## 2023-12-10 DIAGNOSIS — Z7982 Long term (current) use of aspirin: Secondary | ICD-10-CM

## 2023-12-10 DIAGNOSIS — J984 Other disorders of lung: Secondary | ICD-10-CM

## 2023-12-10 HISTORY — DX: Dyspnea, unspecified: R06.00

## 2023-12-10 HISTORY — DX: Contact with and (suspected) exposure to tuberculosis: Z20.1

## 2023-12-10 HISTORY — DX: Atelectasis: J98.11

## 2023-12-10 HISTORY — DX: Prediabetes: R73.03

## 2023-12-10 NOTE — Patient Instructions (Addendum)
 Your procedure is scheduled on: 12/18/23 - Friday Report to the Registration Desk on the 1st floor of the [facility]. To find out your arrival time, please call [phone] between 1PM - 3PM on: 12/17/23 - Thursday If your arrival time is 6:00 am, do not arrive before that time as the [facility] entrance doors do not open until 6:00 am.  REMEMBER: Instructions that are not followed completely may result in serious medical risk, up to and including death; or upon the discretion of your surgeon and anesthesiologist your surgery may need to be rescheduled.  Do not eat food or drink any liquids after midnight the night before surgery.  No gum chewing or hard candies.  One week prior to surgery: Stop Anti-inflammatories (NSAIDS) such as Advil, Aleve, Ibuprofen, Motrin, Naproxen, Naprosyn and Aspirin based products such as Excedrin, Goody's Powder, BC Powder. You may take Tylenol  if needed for pain up until the day of surgery.  Stop ANY OVER THE COUNTER supplements until after surgery :Multiple Vitamin    Continue taking all of your other prescription medications up until the day of surgery.  ON THE DAY OF SURGERY ONLY TAKE THESE MEDICATIONS WITH SIPS OF WATER:  levothyroxine (SYNTHROID)  amLODipine (NORVASC)  TRELEGY ELLIPTA    Use inhaler Albuterol  on the day of surgery and bring to the hospital.   No Alcohol for 24 hours before or after surgery.  No Smoking including e-cigarettes for 24 hours before surgery.  No chewable tobacco products for at least 6 hours before surgery.  No nicotine patches on the day of surgery.  Do not use any "recreational" drugs for at least a week (preferably 2 weeks) before your surgery.  Please be advised that the combination of cocaine and anesthesia may have negative outcomes, up to and including death. If you test positive for cocaine, your surgery will be cancelled.  On the morning of surgery brush your teeth with toothpaste and water, you  may rinse your mouth with mouthwash if you wish. Do not swallow any toothpaste or mouthwash.  Do not wear jewelry, make-up, hairpins, clips or nail polish.  For welded (permanent) jewelry: bracelets, anklets, waist bands, etc.  Please have this removed prior to surgery.  If it is not removed, there is a chance that hospital personnel will need to cut it off on the day of surgery.  Do not wear lotions, powders, or perfumes.   Do not shave body hair from the neck down 48 hours before surgery.  Contact lenses, hearing aids and dentures may not be worn into surgery.  Do not bring valuables to the hospital. [facility] is not responsible for any missing/lost belongings or valuables.   Notify your doctor if there is any change in your medical condition (cold, fever, infection).  Wear comfortable clothing (specific to your surgery type) to the hospital.  After surgery, you can help prevent lung complications by doing breathing exercises.  Take deep breaths and cough every 1-2 hours. Your doctor may order a device called an Incentive Spirometer to help you take deep breaths.  When coughing or sneezing, hold a pillow firmly against your incision with both hands. This is called "splinting." Doing this helps protect your incision. It also decreases belly discomfort.  If you are being admitted to the hospital overnight, leave your suitcase in the car. After surgery it may be brought to your room.  In case of increased patient census, it may be necessary for you, the patient, to  continue your postoperative care in the Same Day Surgery department.  If you are being discharged the day of surgery, you will not be allowed to drive home. You will need a responsible individual to drive you home and stay with you for 24 hours after surgery.   If you are taking public transportation, you will need to have a responsible individual with you.  Please call the Pre-admissions Testing Dept. at [phone]  if you have any questions about these instructions.  Surgery Visitation Policy:  Patients having surgery or a procedure may have two visitors.  Children under the age of 66 must have an adult with them who is not the patient.  Inpatient Visitation:    Visiting hours are 7 a.m. to 8 p.m. Up to four visitors are allowed at one time in a patient room. The visitors may rotate out with other people during the day.  One visitor age 62 or older may stay with the patient overnight and must be in the room by 8 p.m.

## 2023-12-11 ENCOUNTER — Encounter
Admission: RE | Admit: 2023-12-11 | Discharge: 2023-12-11 | Disposition: A | Discharge status: Home / Self Care | Source: Ambulatory Visit | Attending: Pulmonary Disease | Admitting: Pulmonary Disease

## 2023-12-11 DIAGNOSIS — Z01810 Encounter for preprocedural cardiovascular examination: Secondary | ICD-10-CM | POA: Insufficient documentation

## 2023-12-11 DIAGNOSIS — R9431 Abnormal electrocardiogram [ECG] [EKG]: Secondary | ICD-10-CM | POA: Diagnosis not present

## 2023-12-11 DIAGNOSIS — Z7982 Long term (current) use of aspirin: Secondary | ICD-10-CM | POA: Insufficient documentation

## 2023-12-11 DIAGNOSIS — R7303 Prediabetes: Secondary | ICD-10-CM | POA: Diagnosis not present

## 2023-12-11 DIAGNOSIS — Z01812 Encounter for preprocedural laboratory examination: Secondary | ICD-10-CM | POA: Diagnosis not present

## 2023-12-11 DIAGNOSIS — J984 Other disorders of lung: Secondary | ICD-10-CM | POA: Insufficient documentation

## 2023-12-13 LAB — PULMONARY FUNCTION TEST

## 2023-12-18 ENCOUNTER — Ambulatory Visit

## 2023-12-18 ENCOUNTER — Ambulatory Visit
Admission: RE | Admit: 2023-12-18 | Discharge: 2023-12-18 | Disposition: A | Discharge status: Home / Self Care | Attending: Pulmonary Disease | Admitting: Pulmonary Disease

## 2023-12-18 ENCOUNTER — Ambulatory Visit: Payer: Self-pay | Admitting: Urgent Care

## 2023-12-18 ENCOUNTER — Ambulatory Visit: Admitting: Anesthesiology

## 2023-12-18 ENCOUNTER — Other Ambulatory Visit: Payer: Self-pay

## 2023-12-18 ENCOUNTER — Encounter
Admission: RE | Disposition: A | Discharge status: Home / Self Care | Payer: Self-pay | Source: Home / Self Care | Attending: Pulmonary Disease

## 2023-12-18 DIAGNOSIS — E785 Hyperlipidemia, unspecified: Secondary | ICD-10-CM | POA: Diagnosis not present

## 2023-12-18 DIAGNOSIS — Z85818 Personal history of malignant neoplasm of other sites of lip, oral cavity, and pharynx: Secondary | ICD-10-CM | POA: Insufficient documentation

## 2023-12-18 DIAGNOSIS — Z8611 Personal history of tuberculosis: Secondary | ICD-10-CM | POA: Insufficient documentation

## 2023-12-18 DIAGNOSIS — I10 Essential (primary) hypertension: Secondary | ICD-10-CM | POA: Diagnosis not present

## 2023-12-18 DIAGNOSIS — E039 Hypothyroidism, unspecified: Secondary | ICD-10-CM | POA: Diagnosis not present

## 2023-12-18 DIAGNOSIS — R918 Other nonspecific abnormal finding of lung field: Secondary | ICD-10-CM | POA: Diagnosis present

## 2023-12-18 DIAGNOSIS — K219 Gastro-esophageal reflux disease without esophagitis: Secondary | ICD-10-CM | POA: Insufficient documentation

## 2023-12-18 DIAGNOSIS — J45909 Unspecified asthma, uncomplicated: Secondary | ICD-10-CM | POA: Diagnosis not present

## 2023-12-18 DIAGNOSIS — C3411 Malignant neoplasm of upper lobe, right bronchus or lung: Secondary | ICD-10-CM | POA: Diagnosis not present

## 2023-12-18 DIAGNOSIS — Z87891 Personal history of nicotine dependence: Secondary | ICD-10-CM | POA: Insufficient documentation

## 2023-12-18 HISTORY — PX: VIDEO BRONCHOSCOPY WITH ENDOBRONCHIAL NAVIGATION: SHX6175

## 2023-12-18 HISTORY — PX: VIDEO BRONCHOSCOPY WITH ENDOBRONCHIAL ULTRASOUND: SHX6177

## 2023-12-18 LAB — GLUCOSE, CAPILLARY
Glucose-Capillary: 117 mg/dL — ABNORMAL HIGH (ref 70–99)
Glucose-Capillary: 109 mg/dL — ABNORMAL HIGH (ref 70–99)

## 2023-12-18 SURGERY — VIDEO BRONCHOSCOPY WITH ENDOBRONCHIAL NAVIGATION
Anesthesia: General

## 2023-12-18 MED ORDER — LIDOCAINE HCL (CARDIAC) PF 100 MG/5ML IV SOSY
PREFILLED_SYRINGE | INTRAVENOUS | Status: DC | PRN
  Administered 2023-12-18: 100 mg via INTRAVENOUS

## 2023-12-18 MED ORDER — LACTATED RINGERS IV SOLN: INTRAVENOUS | Status: DC

## 2023-12-18 MED ORDER — PHENYLEPHRINE 80 MCG/ML (10ML) SYRINGE FOR IV PUSH (FOR BLOOD PRESSURE SUPPORT)
PREFILLED_SYRINGE | INTRAVENOUS | Status: DC | PRN
  Administered 2023-12-18: 80 ug via INTRAVENOUS

## 2023-12-18 MED ORDER — PROPOFOL 1000 MG/100ML IV EMUL
INTRAVENOUS | Status: AC
  Filled 2023-12-18: qty 300

## 2023-12-18 MED ORDER — ACETAMINOPHEN 10 MG/ML IV SOLN: 1000.0000 mg | Freq: Once | INTRAVENOUS | Status: DC | PRN

## 2023-12-18 MED ORDER — ORAL CARE MOUTH RINSE: 15.0000 mL | Freq: Once | OROMUCOSAL | Status: AC

## 2023-12-18 MED ORDER — SUGAMMADEX SODIUM 200 MG/2ML IV SOLN
INTRAVENOUS | Status: DC | PRN
  Administered 2023-12-18: 200 mg via INTRAVENOUS

## 2023-12-18 MED ORDER — FENTANYL CITRATE (PF) 100 MCG/2ML IJ SOLN: 25.0000 ug | INTRAMUSCULAR | Status: DC | PRN

## 2023-12-18 MED ORDER — DEXAMETHASONE SODIUM PHOSPHATE 10 MG/ML IJ SOLN
INTRAMUSCULAR | Status: DC | PRN
  Administered 2023-12-18: 4 mg via INTRAVENOUS

## 2023-12-18 MED ORDER — DEXAMETHASONE SODIUM PHOSPHATE 10 MG/ML IJ SOLN
INTRAMUSCULAR | Status: AC
  Filled 2023-12-18: qty 1

## 2023-12-18 MED ORDER — MIDAZOLAM HCL 2 MG/2ML IJ SOLN
INTRAMUSCULAR | Status: DC | PRN
  Administered 2023-12-18: 2 mg via INTRAVENOUS

## 2023-12-18 MED ORDER — CHLORHEXIDINE GLUCONATE 0.12 % MT SOLN
OROMUCOSAL | Status: AC
  Filled 2023-12-18: qty 15

## 2023-12-18 MED ORDER — PROPOFOL 10 MG/ML IV BOLUS
INTRAVENOUS | Status: DC | PRN
  Administered 2023-12-18: 200 mg via INTRAVENOUS

## 2023-12-18 MED ORDER — ROCURONIUM BROMIDE 100 MG/10ML IV SOLN
INTRAVENOUS | Status: DC | PRN
  Administered 2023-12-18: 50 mg via INTRAVENOUS

## 2023-12-18 MED ORDER — PROPOFOL 10 MG/ML IV BOLUS
INTRAVENOUS | Status: AC
  Filled 2023-12-18: qty 20

## 2023-12-18 MED ORDER — MIDAZOLAM HCL 2 MG/2ML IJ SOLN
INTRAMUSCULAR | Status: AC
  Filled 2023-12-18: qty 2

## 2023-12-18 MED ORDER — FENTANYL CITRATE (PF) 100 MCG/2ML IJ SOLN
INTRAMUSCULAR | Status: AC
  Filled 2023-12-18: qty 2

## 2023-12-18 MED ORDER — ROCURONIUM BROMIDE 10 MG/ML (PF) SYRINGE
PREFILLED_SYRINGE | INTRAVENOUS | Status: AC
  Filled 2023-12-18: qty 10

## 2023-12-18 MED ORDER — FENTANYL CITRATE (PF) 100 MCG/2ML IJ SOLN
INTRAMUSCULAR | Status: DC | PRN
  Administered 2023-12-18: 50 ug via INTRAVENOUS

## 2023-12-18 MED ORDER — CHLORHEXIDINE GLUCONATE 0.12 % MT SOLN
15.0000 mL | Freq: Once | OROMUCOSAL | Status: AC
  Administered 2023-12-18: 15 mL via OROMUCOSAL

## 2023-12-18 MED ORDER — OXYCODONE HCL 5 MG PO TABS: 5.0000 mg | ORAL_TABLET | Freq: Once | ORAL | Status: DC | PRN

## 2023-12-18 MED ORDER — ONDANSETRON HCL 4 MG/2ML IJ SOLN: 4.0000 mg | Freq: Once | INTRAMUSCULAR | Status: DC | PRN

## 2023-12-18 MED ORDER — DEXAMETHASONE SODIUM PHOSPHATE 10 MG/ML IJ SOLN
INTRAMUSCULAR | Status: AC
  Filled 2023-12-18: qty 2

## 2023-12-18 MED ORDER — OXYCODONE HCL 5 MG/5ML PO SOLN: 5.0000 mg | Freq: Once | ORAL | Status: DC | PRN

## 2023-12-18 MED ORDER — ONDANSETRON HCL 4 MG/2ML IJ SOLN
INTRAMUSCULAR | Status: AC
  Filled 2023-12-18: qty 2

## 2023-12-18 MED ORDER — LACTATED RINGERS IV SOLN: INTRAVENOUS | Status: DC | PRN

## 2023-12-18 MED ORDER — ONDANSETRON HCL 4 MG/2ML IJ SOLN
INTRAMUSCULAR | Status: DC | PRN
  Administered 2023-12-18: 4 mg via INTRAVENOUS

## 2023-12-18 NOTE — Procedures (Signed)
 BRONCHOSCOPY WITH SURGICAL LUNG BIOPSY PROCEDURE NOTE  FIBEROPTIC BRONCHOSCOPY WITH THERAPEUTIC ASPIRATION OF MUCUS PLUGGING AND BRONCHOALVEOLAR LAVAGE PROCEDURE NOTE  ENDOBRONCHIAL ULTRASOUND >/1 LYMPH NODE PROCEDURE NOTE    Flexible bronchoscopy was performed  by : Jaclynn Mast MD  assistance by : 1)Repiratory therapist  and 2[employer] staff and 3) Anesthesia team and 4) Flouroscopy team and 5) [employer] supporting staff   Indication for the procedure was :  Pre-procedural H&P. The following assessment was performed on the day of the procedure prior to initiating sedation History:  Chest pain n Dyspnea y Hemoptysis n Cough y Fever n Other pertinent items n  Examination Vital signs -reviewed as per nursing documentation today Cardiac    Murmurs: n  Rubs : n  Gallop: n Lungs Wheezing: n Rales : n Rhonchi :y  Other pertinent findings: SOB/hypoxemia due to chronic lung disease   Pre-procedural assessment for Procedural Sedation included: Depth of sedation: As per anesthesia team  ASA Classification:  2 Mallampati airway assessment: 3    Medication list reviewed: y  The patient's interval history was taken and revealed: no new complaints The pre- procedure physical examination revealed: No new findings Refer to prior clinic note for details.  Informed Consent: Informed consent was obtained from:  patient after explanation of procedure and risks, benefits, as well as alternative procedures available.  Explanation of level of sedation and possible transfusion was also provided.    Procedural Preparation: Time out was performed and patient was identified by name and birthdate and procedure to be performed and side for sampling, if any, was specified. Pt was intubated by anesthesia.  The patient was appropriately draped.   Fiberoptic bronchoscopy with airway inspection , therapeutic aspiration of tracheobronchial tree and BAL Procedure findings:  Bronchoscope was  inserted via ETT  without difficulty.  Posterior oropharynx, epiglottis, arytenoids, false cords and vocal cords were not visualized as these were bypassed by endotracheal tube. The distal trachea was normal in circumference and appearance without mucosal, cartilaginous or branching abnormalities.  The main carina was mildly splayed . All right and left lobar airways were visualized to the Subsegmental level.  Sub- sub segmental carinae were identified in all the distal airways.   Secretions were visible in the following airways and appeared to be clear.  The mucosa was : friable at RUL  Airways were notable for:        exophytic lesions :n       extrinsic compression in the following distributions: n.       Friable mucosa: y       Teacher, music /pigmentation: n   Surgical endobronchial biopsies were obtain with 5 endobronchial biopsies.  Total of 1.7cm x 1.0cm of tissue removed   Post procedure Diagnosis:   Mucus plugging of RML and RLL     Media Information  Document Information  RUL exophytic mass   Post procedure diagnosis: lung cancer       Endobronchial ultrasound assisted hilar and mediastinal lymph node biopsies procedure findings: The fiberoptic bronchoscope was removed and the EBUS scope was introduced. Examination began to evaluate for pathologically enlarged lymph nodes starting on the LEFT  side progressing to the RIGHT side.  All lymph node biopsies performed with 22g needle. Lymph node biopsies were sent in cytolite for all stations.  Station 10L - 5mm not biopsied Station 7 - 1.8cm - biopsied 3 times Station 10R - 7mm not biopsied Station 4R - 6mm not biopsied    Post procedure diagnosis:  lymphadenopathy of subcarinal N2 station   Immediate sampling complications included:NONE  Epinephrine  zero ml was used topically  The bronchoscopy was terminated due to completion of the planned procedure and the bronchoscope was removed.   Total dosage of  Lidocaine  was 3mg  Total fluoroscopy time was zero minutes  Supplemental oxygen was provided at as per anesthesia lpm by nasal canula post operatively  Estimated Blood loss: <5expected cc.  Complications included:  NONE immediate   Preliminary CXR findings :  In process   Disposition: home with family   Follow up with Dr. Charmon Thorson in 5 days for result discussion.     Erskin Hearing MD  [facility] Division of Pulmonary & Critical Care Medicine

## 2023-12-18 NOTE — Anesthesia Procedure Notes (Signed)
 Procedure Name: Intubation Date/Time: 12/18/2023 2:56 PM  Performed by: Lourena Royal, RNPre-anesthesia Checklist: Patient identified, Emergency Drugs available, Suction available and Patient being monitored Patient Re-evaluated:Patient Re-evaluated prior to induction Oxygen Delivery Method: Circle system utilized Preoxygenation: Pre-oxygenation with 100% oxygen Induction Type: IV induction Ventilation: Mask ventilation without difficulty Laryngoscope Size: McGrath and 4 Grade View: Grade I Tube type: Oral Tube size: 8.0 mm Number of attempts: 1 Airway Equipment and Method: Stylet and Oral airway Placement Confirmation: ETT inserted through vocal cords under direct vision, positive ETCO2 and breath sounds checked- equal and bilateral Secured at: 23 cm Tube secured with: Tape Dental Injury: Teeth and Oropharynx as per pre-operative assessment

## 2023-12-18 NOTE — Anesthesia Preprocedure Evaluation (Addendum)
 Anesthesia Evaluation  Patient identified by MRN, date of birth, ID band Patient awake    Reviewed: Allergy & Precautions, H&P , NPO status , Patient's Chart, lab work & pertinent test results  History of Anesthesia Complications (+) history of anesthetic complications (Family history of adverse reaction to anesthesia     Comment:  sister went into cardiac arrest after anesthesia               induction, MOM had issues but doesnt know what happened)  Airway Mallampati: I   Neck ROM: Full    Dental  (+) Upper Dentures, Lower Dentures   Pulmonary asthma , former smoker (quit 03/2023) A CT scan performed on Dec 02, 2023, revealed a partial collapse of the right upper lobe. The collapse is limited to this area, and she is concerned about the possibility of cancer, although the collapse obscures clear imaging of the region.  12/11/23- PFT - FEV1/FVC 84% with FEV1 1.34L- 75% TLC 3.90L -72% and DLCO 84% - borderline mild restrictive ventilatory defect with normal DLCO consistent with asthma and overlying collapse of RUL.   Pulmonary exam normal breath sounds clear to auscultation       Cardiovascular hypertension, Normal cardiovascular exam Rhythm:Regular Rate:Normal  ECG 12/11/23:  Normal sinus rhythm Possible Left atrial enlargement Septal infarct , age undetermined   Neuro/Psych  Neuromuscular disease (neuropathy)    GI/Hepatic Neg liver ROS,GERD  ,,  Endo/Other  Hypothyroidism  Class 3 obesityPrediabetes   Renal/GU      Musculoskeletal   Abdominal   Peds  Hematology negative hematology ROS (+)   Anesthesia Other Findings Oral cancer diagnosed in 2010, located in the upper palate on the left side. The cancer was surgically excised, but closure attempts left a small opening through which mucus can be expelled, especially during upper respiratory infection  Reproductive/Obstetrics                              Anesthesia Physical Anesthesia Plan  ASA: 3  Anesthesia Plan: General   Post-op Pain Management:    Induction: Intravenous  PONV Risk Score and Plan: 3 and Ondansetron , Dexamethasone  and Treatment may vary due to age or medical condition  Airway Management Planned: Oral ETT  Additional Equipment:   Intra-op Plan:   Post-operative Plan: Extubation in OR  Informed Consent: I have reviewed the patients History and Physical, chart, labs and discussed the procedure including the risks, benefits and alternatives for the proposed anesthesia with the patient or authorized representative who has indicated his/her understanding and acceptance.     Dental advisory given  Plan Discussed with: CRNA  Anesthesia Plan Comments: (Patient consented for risks of anesthesia including but not limited to:  - adverse reactions to medications - damage to eyes, teeth, lips or other oral mucosa - nerve damage due to positioning  - sore throat or hoarseness - damage to heart, brain, nerves, lungs, other parts of body or loss of life  Informed patient about role of CRNA in peri- and intra-operative care.  Patient voiced understanding.)        Anesthesia Quick Evaluation

## 2023-12-18 NOTE — Transfer of Care (Signed)
 Immediate Anesthesia Transfer of Care Note  Patient: Ashlee Miller  Procedure(s) Performed: VIDEO BRONCHOSCOPY WITH ENDOBRONCHIAL NAVIGATION BRONCHOSCOPY, WITH EBUS  Patient Location: PACU  Anesthesia Type:General  Level of Consciousness: drowsy  Airway & Oxygen Therapy: Patient Spontanous Breathing and Patient connected to face mask oxygen  Post-op Assessment: Report given to RN, Post -op Vital signs reviewed and stable, and Patient moving all extremities X 4  Post vital signs: Reviewed and stable  Last Vitals:  Vitals Value Taken Time  BP 123/70 12/18/23 1556  Temp    Pulse 82 12/18/23 1556  Resp 23 12/18/23 1556  SpO2 100 % 12/18/23 1556  Vitals shown include unfiled device data.  Last Pain:  Vitals:   12/18/23 1312  TempSrc: Temporal  PainSc: 0-No pain         Complications: No notable events documented.

## 2023-12-18 NOTE — H&P (Signed)
 PULMONOLOGY         Date: 12/18/2023,   MRN# [id number] Ashlee Miller May 24, 1951     AdmissionWeight: 104.3 kg                 CurrentWeight: 104.3 kg  Referring provider: Dr Jamal Mays    CHIEF COMPLAINT:   Right upper lobe lesion with collapse    HISTORY OF PRESENT ILLNESS   This is a 73 yo F with hx of oral cancer, hx of TB exposure, dyslipidemia, asthma, GERD, HTN, neuropathy who came in to pulmonary clinic with DOE.  She had chest imaging with findings complete Atelectasis/collapse of the right upper lobe with suspected right  main stem bronchus endobronchial lesion.  Upon further detailed review of CT chest it is difficult to tell weather it is a endobronchial lesion or not but definite RUL collapse is noted. We discussed this and patient wishes to have full scope of therapy to investigate underlying etiology including any potential cancerous lesion.  Plan for bronchoscopy with airway inspection, bronchoalveolar lavage, therapeutic aspiration of tracheobronchial tree, tranbronchial lung biopsies/endobronchial lung biopsies and enbobronchial ultrasound evaluation of lymph node stations. We may need to use robotic platform if regular scope is unable to pass into narrowed airways but cannot tell until intraoperative evaluation. We discussed plan and answered questions and patient wishes to proceed as planned. Reviewed risks/complications and benefits with patient, risks include infection, pneumothorax/pneumomediastinum which may require chest tube placement as well as overnight/prolonged hospitalization and possible mechanical ventilation. Other risks include bleeding and very rarely death.  Patient understands risks and wishes to proceed.  Additional questions were answered, and patient is aware that post procedure patient will be going home with family and may experience cough with possible clots on expectoration as well as phlegm which may last few days as well as hoarseness of  voice post intubation and mechanical ventilation.    PAST MEDICAL HISTORY   Past Medical History:  Diagnosis Date   Abnormal growth of clavicle 01/2018   Asthma    allergies frequently. helps to minimize episodes   Atelectasis    Cancer (HCC) 2010   Oral ca. no chemo or radiation, just surgery   Dyspnea    Exposure to TB    as a teenager   Family history of adverse reaction to anesthesia    sister went into cardiac arrest after anesthesia induction, MOM had issues but doesnt know what happened   GERD (gastroesophageal reflux disease)    History of seasonal allergies    HLD (hyperlipidemia)    Hypertension    Hypothyroidism    Neuropathy    Pre-diabetes    Sun allergy      SURGICAL HISTORY   Past Surgical History:  Procedure Laterality Date   BREAST CYST ASPIRATION Right 1999   CARPAL TUNNEL RELEASE Bilateral 2006   COLONOSCOPY W/ POLYPECTOMY     MASS EXCISION Right 02/26/2018   Procedure: EXCISION OF MASS OF RIGHT CLAVICLE AREA;  Surgeon: Jacolyn Matar, MD;  Location: [facility] ORS;  Service: General;  Laterality: Right;   MULTIPLE TOOTH EXTRACTIONS  01/2018   alll teeth pulled at that time   soft tissue mass removed  2010   removed soft tissues in mouth for positive cancer     FAMILY HISTORY   Family History  Problem Relation Age of Onset   Breast cancer Sister 93   Breast cancer Maternal Aunt 58     SOCIAL HISTORY   Social History  Tobacco Use   Smoking status: Former    Current packs/day: 0.00    Types: Cigarettes    Quit date: 2010    Years since quitting: 15.4   Smokeless tobacco: Never  Vaping Use   Vaping status: Never Used  Substance Use Topics   Alcohol use: Never   Drug use: Never     MEDICATIONS      Current Medication:  Current Facility-Administered Medications:    lactated ringers  infusion, , Intravenous, Continuous, Lattie Poli, MD, Last Rate: 10 mL/hr at 12/18/23 1333, New Bag at 12/18/23 1333    ALLERGIES    Penicillins, Ace inhibitors, and Fenofibrate     REVIEW OF SYSTEMS    Review of Systems:  Gen:  Denies  fever, sweats, chills weigh loss  HEENT: Denies blurred vision, double vision, ear pain, eye pain, hearing loss, nose bleeds, sore throat Cardiac:  No dizziness, chest pain or heaviness, chest tightness,edema Resp:   reports dyspnea chronically  Gi: Denies swallowing difficulty, stomach pain, nausea or vomiting, diarrhea, constipation, bowel incontinence Gu:  Denies bladder incontinence, burning urine Ext:   Denies Joint pain, stiffness or swelling Skin: Denies  skin rash, easy bruising or bleeding or hives Endoc:  Denies polyuria, polydipsia , polyphagia or weight change Psych:   Denies depression, insomnia or hallucinations   Other:  All other systems negative   VS: BP (!) 141/77   Pulse 77   Temp 97.7 F (36.5 C) (Temporal)   Resp 16   Ht 5\' 4"  (1.626 m)   Wt 104.3 kg   SpO2 93%   BMI 39.48 kg/m      PHYSICAL EXAM    GENERAL:NAD, no fevers, chills, no weakness no fatigue HEAD: Normocephalic, atraumatic.  EYES: Pupils equal, round, reactive to light. Extraocular muscles intact. No scleral icterus.  MOUTH: Moist mucosal membrane. Dentition intact. No abscess noted.  EAR, NOSE, THROAT: Clear without exudates. No external lesions.  NECK: Supple. No thyromegaly. No nodules. No JVD.  PULMONARY: decreased breath sounds with mild rhonchi worse at bases bilaterally.  CARDIOVASCULAR: S1 and S2. Regular rate and rhythm. No murmurs, rubs, or gallops. No edema. Pedal pulses 2+ bilaterally.  GASTROINTESTINAL: Soft, nontender, nondistended. No masses. Positive bowel sounds. No hepatosplenomegaly.  MUSCULOSKELETAL: No swelling, clubbing, or edema. Range of motion full in all extremities.  NEUROLOGIC: Cranial nerves II through XII are intact. No gross focal neurological deficits. Sensation intact. Reflexes intact.  SKIN: No ulceration, lesions, rashes, or cyanosis. Skin  warm and dry. Turgor intact.  PSYCHIATRIC: Mood, affect within normal limits. The patient is awake, alert and oriented x 3. Insight, judgment intact.       IMAGING   @IMAGES @   ASSESSMENT/PLAN   Right upper lobe lesion with collapse    Plan for bronchoscopy with airway inspection, bronchoalveolar lavage, therapeutic aspiration of tracheobronchial tree, tranbronchial lung biopsies/endobronchial lung biopsies and enbobronchial ultrasound evaluation of lymph node stations. We may need to use robotic platform if regular scope is unable to pass into narrowed airways but cannot tell until intraoperative evaluation.    -Reviewed risks/complications and benefits with patient, risks include infection, pneumothorax/pneumomediastinum which may require chest tube placement as well as overnight/prolonged hospitalization and possible mechanical ventilation. Other risks include bleeding and very rarely death.  Patient understands risks and wishes to proceed.  Additional questions were answered, and patient is aware that post procedure patient will be going home with family and may experience cough with possible clots on expectoration as well as phlegm  which may last few days as well as hoarseness of voice post intubation and mechanical ventilation.           Thank you for allowing me to participate in the care of this patient.   Patient/Family are satisfied with care plan and all questions have been answered.    Provider disclosure: Patient with at least one acute or chronic illness or injury that poses a threat to life or bodily function and is being managed actively during this encounter.  All of the below services have been performed independently by signing provider:  review of prior documentation from internal and or external health records.  Review of previous and current lab results.  Interview and comprehensive assessment during patient visit today. Review of current and previous chest  radiographs/CT scans. Discussion of management and test interpretation with health care team and patient/family.   This document was prepared using Dragon voice recognition software and may include unintentional dictation errors.     Jayde Daffin, M.D.  Division of Pulmonary & Critical Care Medicine

## 2023-12-21 ENCOUNTER — Encounter: Payer: Self-pay | Admitting: Pulmonary Disease

## 2023-12-21 LAB — SURGICAL PATHOLOGY

## 2023-12-21 LAB — CYTOLOGY - NON PAP

## 2023-12-21 NOTE — Anesthesia Postprocedure Evaluation (Signed)
 Anesthesia Post Note  Patient: Ashlee Miller  Procedure(s) Performed: VIDEO BRONCHOSCOPY WITH ENDOBRONCHIAL NAVIGATION BRONCHOSCOPY, WITH EBUS  Patient location during evaluation: PACU Anesthesia Type: General Level of consciousness: awake and alert Pain management: pain level controlled Vital Signs Assessment: post-procedure vital signs reviewed and stable Respiratory status: spontaneous breathing, nonlabored ventilation, respiratory function stable and patient connected to nasal cannula oxygen Cardiovascular status: blood pressure returned to baseline and stable Postop Assessment: no apparent nausea or vomiting Anesthetic complications: no   No notable events documented.   Last Vitals:  Vitals:   12/18/23 1625 12/18/23 1633  BP: (!) 104/90 (!) 148/73  Pulse: 88 85  Resp: 20 18  Temp: (!) 36.2 C (!) 36.3 C  SpO2: 99% 95%    Last Pain:  Vitals:   12/18/23 1633  TempSrc: Temporal  PainSc: 0-No pain                 Enrique Harvest

## 2023-12-23 ENCOUNTER — Encounter: Payer: Self-pay | Admitting: *Deleted

## 2023-12-23 NOTE — Progress Notes (Signed)
 Referral received and pt contacted by referral coordinator to schedule new pt visit. Per referral coordinator:  "Spoke with patient--she is tearful and would like some time to process, she did not realize that results were available and that this was her diagnosis. She has my direct ext to (hopefully) call back soon to schedule with me and she is aware that Dema Filler is available to her to explain the clinical information."  Pt is scheduled to follow up with Dr. Jaclynn Mast on 6/5 and will further discuss results/referral with him at that visit. Will continue to follow and await pt callback.

## 2023-12-28 ENCOUNTER — Inpatient Hospital Stay

## 2023-12-28 ENCOUNTER — Encounter: Payer: Self-pay | Admitting: Internal Medicine

## 2023-12-28 ENCOUNTER — Inpatient Hospital Stay: Attending: Internal Medicine | Admitting: Internal Medicine

## 2023-12-28 VITALS — Ht 64.0 in | Wt 234.0 lb

## 2023-12-28 DIAGNOSIS — Z87891 Personal history of nicotine dependence: Secondary | ICD-10-CM | POA: Diagnosis not present

## 2023-12-28 DIAGNOSIS — C3411 Malignant neoplasm of upper lobe, right bronchus or lung: Secondary | ICD-10-CM | POA: Diagnosis present

## 2023-12-28 DIAGNOSIS — Z85818 Personal history of malignant neoplasm of other sites of lip, oral cavity, and pharynx: Secondary | ICD-10-CM | POA: Insufficient documentation

## 2023-12-28 DIAGNOSIS — R7303 Prediabetes: Secondary | ICD-10-CM | POA: Insufficient documentation

## 2023-12-28 DIAGNOSIS — R197 Diarrhea, unspecified: Secondary | ICD-10-CM | POA: Insufficient documentation

## 2023-12-28 NOTE — Progress Notes (Unsigned)
 [facility] CONSULT NOTE  Patient Care Team: Macie Saxon, MD as PCP - General (Family Medicine) Drake Gens, RN as Oncology Nurse Navigator Gwyn Leos, MD as Consulting Physician (Oncology)  CHIEF COMPLAINTS/PURPOSE OF CONSULTATION: lung cancer    Oncology History Overview Note  MPRESSION: 1. Atelectasis/collapse of the right upper lobe with suspected right main stem bronchus endobronchial lesion. Recommend pulmonology referral and consideration for bronchoscopic evaluation and treatment. 2. No evidence of pulmonary embolism to the segmental level.   These results will be called to the ordering clinician or representative by the Radiologist Assistant, and communication documented in the PACS or [employer].     Electronically Signed   By: Reagan Camera M.D.   On: 12/02/2023 16:54   Malignant neoplasm of right upper lobe of lung (HCC)  12/28/2023 Initial Diagnosis   Malignant neoplasm of right upper lobe of lung (HCC)   12/30/2023 Cancer Staging   Staging form: Lung, AJCC V9 - Clinical: Stage IA1 (cT1a, cN0, cM0) - Signed by Gwyn Leos, MD on 12/30/2023     HISTORY OF PRESENTING ILLNESS:  Patient ambulating-independently. Accompanied by husband.   Ashlee Miller 73 y.o.  female pleasant patient with history of smoking/asthma quit approximately 6 months ago recently diagnosed with lung cancer is for initial consultation.  Of note patient had oral cancer-in 2020 when she underwent surgery without any chemo or radiation at [facility].  Patient noted to have progressive cough over the last many months.  Which led to further imaging with a chest x-ray which was abnormal.  This was followed by further evaluation with a lung scan and also further evaluation with pulmonary.  Patient is currently status post bronchoscopy.  She is here to review the treatment plan/pathology.  Denies any headaches.  Nausea vomiting.  No fever no chills.  Review  of Systems  Constitutional:  Negative for chills, diaphoresis, fever, malaise/fatigue and weight loss.  HENT:  Negative for nosebleeds and sore throat.   Eyes:  Negative for double vision.  Respiratory:  Negative for cough, hemoptysis, sputum production, shortness of breath and wheezing.   Cardiovascular:  Negative for chest pain, palpitations, orthopnea and leg swelling.  Gastrointestinal:  Negative for abdominal pain, blood in stool, constipation, diarrhea, heartburn, melena, nausea and vomiting.  Genitourinary:  Negative for dysuria, frequency and urgency.  Musculoskeletal:  Negative for back pain and joint pain.  Skin: Negative.  Negative for itching and rash.  Neurological:  Negative for dizziness, tingling, focal weakness, weakness and headaches.  Endo/Heme/Allergies:  Does not bruise/bleed easily.  Psychiatric/Behavioral:  Negative for depression. The patient is not nervous/anxious and does not have insomnia.     MEDICAL HISTORY:  Past Medical History:  Diagnosis Date   Abnormal growth of clavicle 01/2018   Asthma    allergies frequently. helps to minimize episodes   Atelectasis    Cancer (HCC) 2010   Oral ca. no chemo or radiation, just surgery   Dyspnea    Exposure to TB    as a teenager   Family history of adverse reaction to anesthesia    sister went into cardiac arrest after anesthesia induction, MOM had issues but doesnt know what happened   GERD (gastroesophageal reflux disease)    History of seasonal allergies    HLD (hyperlipidemia)    Hypertension    Hypothyroidism    Neuropathy    Pre-diabetes    Sun allergy     SURGICAL HISTORY: Past Surgical History:  Procedure  Laterality Date   BREAST CYST ASPIRATION Right 1999   CARPAL TUNNEL RELEASE Bilateral 2006   COLONOSCOPY W/ POLYPECTOMY     MASS EXCISION Right 02/26/2018   Procedure: EXCISION OF MASS OF RIGHT CLAVICLE AREA;  Surgeon: Jacolyn Matar, MD;  Location: [facility] ORS;  Service: General;  Laterality:  Right;   MULTIPLE TOOTH EXTRACTIONS  01/2018   alll teeth pulled at that time   soft tissue mass removed  2010   removed soft tissues in mouth for positive cancer   VIDEO BRONCHOSCOPY WITH ENDOBRONCHIAL NAVIGATION N/A 12/18/2023   Procedure: VIDEO BRONCHOSCOPY WITH ENDOBRONCHIAL NAVIGATION;  Surgeon: Erskin Hearing, MD;  Location: [facility] ORS;  Service: Thoracic;  Laterality: N/A;   VIDEO BRONCHOSCOPY WITH ENDOBRONCHIAL ULTRASOUND N/A 12/18/2023   Procedure: BRONCHOSCOPY, WITH EBUS;  Surgeon: Erskin Hearing, MD;  Location: [facility] ORS;  Service: Thoracic;  Laterality: N/A;    SOCIAL HISTORY: Social History   Socioeconomic History   Marital status: Married    Spouse name: Akridge,Jc (Spouse)   Number of children: Not on file   Years of education: Not on file   Highest education level: Not on file  Occupational History   Not on file  Tobacco Use   Smoking status: Former    Current packs/day: 0.00    Types: Cigarettes    Quit date: 2010    Years since quitting: 15.4   Smokeless tobacco: Never  Vaping Use   Vaping status: Never Used  Substance and Sexual Activity   Alcohol use: Never   Drug use: Never   Sexual activity: Not on file  Other Topics Concern   Not on file  Social History Narrative   Not on file   Social Drivers of Health   Financial Resource Strain: High Risk (11/30/2023)   Received from [facility]   Overall Financial Resource Strain (CARDIA)    Difficulty of Paying Living Expenses: Hard  Food Insecurity: No Food Insecurity (11/30/2023)   Received from [facility]   Hunger Vital Sign    Worried About Running Out of Food in the Last Year: Never true    Ran Out of Food in the Last Year: Never true  Transportation Needs: No Transportation Needs (11/30/2023)   [facility] - Transportation    In the past 12 months, has lack of transportation kept you from medical appointments or from getting  medications?: No    Lack of Transportation (Non-Medical): No  Physical Activity: Not on file  Stress: Not on file  Social Connections: Not on file  Intimate Partner Violence: Not At Risk (12/28/2023)   Humiliation, Afraid, Rape, and Kick questionnaire    Fear of Current or Ex-Partner: No    Emotionally Abused: No    Physically Abused: No    Sexually Abused: No    FAMILY HISTORY: Family History  Problem Relation Age of Onset   Breast cancer Sister 75   Lung cancer Brother    Breast cancer Maternal Aunt 34    ALLERGIES:  is allergic to penicillins, ace inhibitors, and fenofibrate.  MEDICATIONS:  Current Outpatient Medications  Medication Sig Dispense Refill   Albuterol Sulfate (PROAIR RESPICLICK) 108 (90 Base) MCG/ACT AEPB Inhale 2 puffs into the lungs every 6 (six) hours as needed.     amLODipine (NORVASC) 2.5 MG tablet Take 2.5 mg by mouth daily.     APPLE CIDER VINEGAR PO Take by mouth at bedtime. Gummy  aspirin EC 81 MG tablet Take 81 mg by mouth at bedtime.     atorvastatin (LIPITOR) 80 MG tablet Take 80 mg by mouth at bedtime.     B Complex-C (B-COMPLEX WITH VITAMIN C) tablet Take 1 tablet by mouth daily.     chlorthalidone (HYGROTON) 25 MG tablet Take 25 mg by mouth daily.     Cholecalciferol (VITAMIN D3) 125 MCG (5000 UT) TABS Take by mouth daily.     cyanocobalamin (VITAMIN B12) 500 MCG tablet Take 500 mcg by mouth daily.     fluticasone (FLONASE) 50 MCG/ACT nasal spray Place 1 spray into both nostrils as needed for allergies or rhinitis.     Fluticasone-Umeclidin-Vilant (TRELEGY ELLIPTA) 100-62.5-25 MCG/ACT AEPB Inhale 1 puff into the lungs daily.     gabapentin  (NEURONTIN ) 300 MG capsule Take 1,500 mg by mouth at bedtime.      glipiZIDE (GLUCOTROL XL) 10 MG 24 hr tablet Take 10 mg by mouth daily with breakfast.     ibuprofen (ADVIL) 400 MG tablet Take 400 mg by mouth every 6 (six) hours as needed.     levocetirizine (XYZAL) 5 MG tablet Take 5 mg by mouth daily.      levothyroxine (SYNTHROID, LEVOTHROID) 88 MCG tablet Take 88 mcg by mouth daily before breakfast.     losartan (COZAAR) 100 MG tablet Take 100 mg by mouth daily.     MEDROL 4 MG TBPK tablet take dose pak as directed on package: 6-5-4-3-2-1     metoprolol tartrate (LOPRESSOR) 50 MG tablet Take 100 mg by mouth at bedtime.     montelukast (SINGULAIR) 10 MG tablet Take 10 mg by mouth at bedtime.     Multiple Vitamin (MULTIVITAMIN WITH MINERALS) TABS tablet Take 1 tablet by mouth daily. Women's multivitamin     Multiple Vitamins-Minerals (HAIR SKIN NAILS PO) Take by mouth daily.     omeprazole (PRILOSEC) 40 MG capsule Take 40 mg by mouth at bedtime.      potassium chloride (K-DUR) 10 MEQ tablet Take 10 mEq by mouth daily.     spironolactone (ALDACTONE) 25 MG tablet Take 25 mg by mouth daily.     zinc gluconate 50 MG tablet Take 50 mg by mouth daily.     Turmeric (QC TUMERIC COMPLEX) 500 MG CAPS Take by mouth. (Patient not taking: Reported on 12/28/2023)     No current facility-administered medications for this visit.    PHYSICAL EXAMINATION:   There were no vitals filed for this visit. Filed Weights   12/28/23 1356  Weight: 234 lb (106.1 kg)    Physical Exam Vitals and nursing note reviewed.  HENT:     Head: Normocephalic and atraumatic.     Mouth/Throat:     Pharynx: Oropharynx is clear.  Eyes:     Extraocular Movements: Extraocular movements intact.     Pupils: Pupils are equal, round, and reactive to light.  Cardiovascular:     Rate and Rhythm: Normal rate and regular rhythm.  Pulmonary:     Comments: Decreased breath sounds bilaterally.  Abdominal:     Palpations: Abdomen is soft.  Musculoskeletal:        General: Normal range of motion.     Cervical back: Normal range of motion.  Skin:    General: Skin is warm.  Neurological:     General: No focal deficit present.     Mental Status: She is alert and oriented to person, place, and time.  Psychiatric:  Behavior:  Behavior normal.        Judgment: Judgment normal.     LABORATORY DATA:  I have reviewed the data as listed Lab Results  Component Value Date   WBC 8.1 01/15/2018   HGB 13.9 02/26/2018   HCT 41.0 02/26/2018   MCV 83.8 01/15/2018   PLT 260 01/15/2018   No results for input(s): NA, K, CL, CO2, GLUCOSE, BUN, CREATININE, CALCIUM, GFRNONAA, GFRAA, PROT, ALBUMIN, AST, ALT, ALKPHOS, BILITOT, BILIDIR, IBILI in the last 8760 hours.  RADIOGRAPHIC STUDIES: I have personally reviewed the radiological images as listed and agreed with the findings in the report. DG Chest Port 1 View Result Date: 12/18/2023 CLINICAL DATA:  [id number] S/P bronchoscopy [id number] EXAM: PORTABLE CHEST - 1 VIEW COMPARISON:  Dec 05, 2022, Dec 02, 2023 FINDINGS: Unchanged right upper lobe collapse. Elevation of the right hemidiaphragm, due to underlying volume loss. No pneumothorax or pleural effusion. No cardiomegaly. No acute fracture or destructive lesion. IMPRESSION: Unchanged right upper lobe collapse.  No pneumothorax. Electronically Signed   By: Rance Burrows M.D.   On: 12/18/2023 17:59   CT Angio Chest Pulmonary Embolism (PE) W or WO Contrast Result Date: 12/02/2023 CLINICAL DATA:  Positive D-dimer EXAM: CT ANGIOGRAPHY CHEST WITH CONTRAST TECHNIQUE: Multidetector CT imaging of the chest was performed using the standard protocol during bolus administration of intravenous contrast. Multiplanar CT image reconstructions and MIPs were obtained to evaluate the vascular anatomy. Multiplanar image (3D post-processing) reconstructions and MIPs were obtained to evaluate the vascular anatomy. RADIATION DOSE REDUCTION: This exam was performed according to the departmental dose-optimization program which includes automated exposure control, adjustment of the mA and/or kV according to patient size and/or use of iterative reconstruction technique. CONTRAST:  75mL OMNIPAQUE  IOHEXOL  350 MG/ML SOLN  COMPARISON:  Chest x-ray performed Dec 05, 2022 FINDINGS: Cardiovascular: Satisfactory opacification of the pulmonary arteries to the segmental level. No evidence of pulmonary embolism. Normal heart size. No pericardial effusion. Main pulmonary artery: Within normal limits for size. Mediastinum/Nodes: No enlarged mediastinal, hilar, or axillary lymph nodes. Thyroid  gland, trachea, and esophagus demonstrate no significant findings. Coronal imaging also demonstrates the presence of an endobronchial density best seen on image 107 of series 7 which is annotated. Lungs/Pleura: Right upper lobe atelectasis with volume loss. No significant pleural effusion. No pneumothorax. Upper Abdomen: Gallstones. Musculoskeletal: No chest wall abnormality. No acute or significant osseous findings. Review of the MIP images confirms the above findings. IMPRESSION: 1. Atelectasis/collapse of the right upper lobe with suspected right main stem bronchus endobronchial lesion. Recommend pulmonology referral and consideration for bronchoscopic evaluation and treatment. 2. No evidence of pulmonary embolism to the segmental level. These results will be called to the ordering clinician or representative by the Radiologist Assistant, and communication documented in the PACS or [employer]. Electronically Signed   By: Reagan Camera M.D.   On: 12/02/2023 16:54     Malignant neoplasm of right upper lobe of lung (HCC) # MAY 14th, 2025- [coughing- d- dimer] Atelectasis/collapse of the right upper lobe with suspected right main stem bronchus endobronchial lesion.  No evidence of pulmonary embolism to the segmental level. Lung, biopsy, RUL : SQUAMOUS CELL CARCINOMA. [ history of polypmorphous low-grade  adenocarcinoma of the palate, diagnosed in June 2010 ].   # Clinically stage I-given the suspected small size of the endobronchial lesion.  Recommend a PET scan ASAP.  # Based upon the results of the PET scan /final staging -discussed at  tumor conference we will decide the  next step in treatment plan. In general I discussed that early stage cancers are treated with surgery.  However if patient is not a surgical candidate then considerations include radiation alone vs chemoradiation.  Again with treatment plan will be based on final staging/and discussion at the conference.  # Oral cancer s/p surgery- [facility] [2010-  polypmorphous low-grade  adenocarcinoma of the palate,]- no chemotherapy or radiation.  Reviewed that current lung cancer is not related to her oral cancer.  There are 2 separate processes.  # Prediabetes- [on glipizide] metformin- diarrhea.  Monitor closely IF  patient needs to be on systemic therapy.  # History of asthma-[Dr.A] stable   Thank you Dr.Aleskerov for allowing me to participate in the care of your pleasant patient. Please do not hesitate to contact me with questions or concerns in the interim.  Discussed with patient reviewed.  # DISPOSITION: # no labs today # PET scan ASAP # follow up TBD- Dr.B  # I reviewed the blood work- with the patient in detail; also reviewed the imaging independently [as summarized above]; and with the patient in detail.      Above plan of care was discussed with patient/family in detail.  My contact information was given to the patient/family.     Gwyn Leos, MD 12/30/2023 3:55 PM

## 2023-12-28 NOTE — Assessment & Plan Note (Signed)
 # MAY 14th, 2025- [coughing- d- dimer] Atelectasis/collapse of the right upper lobe with suspected right main stem bronchus endobronchial lesion.  No evidence of pulmonary embolism to the segmental level.   S/p  pulmonology referral and consideration for bronchoscopic evaluation and treatment.  Lung, biopsy, RUL :       - SQUAMOUS CELL CARCINOMA.        Diagnosis Note : Per [facility] the patient has a history of polypmorphous low-grade       adenocarcinoma of the palate, diagnosed in June 2010. Recent imaging revealed       collapse of the right upper lobe with a suspected right main stem endobronchial       lesion.         #Discussed that early stage cancers are treated with surgery.  We will make a surgical referral.  Check PFTs.   ----------------------   #Discussed early stage lung cancer treated with surgery.  However patient is considered not a good candidate for surgery [given age comorbidities] or declined surgery --next best option would be SBRT. We will make a referral to radiation oncology.  -------------------------------------    # Stage I adenocarcinoma of the left lower lobe; s/p resection.  Margins/mediastinal lymph nodes negative.I reviewed the CT scan/PET scan patient in detail. I reviewed the pathology stage with the patient in detail.    # Discussed that given early stage cancer/low risk of recurrence-there is no significant benefit for any adjuvant therapy- chemotherapy or radiation.   #However patient would be at risk for recurrence/new primary-given the history of smoking/see discussion below.    #Right lower lobe stage IB [pT2aN0- T=3cm; but positive for visceral pleural invasion] moderate differentiated; squamous cell carcinoma of the Right lower lobe; s/p resection [Dr.Henderickson].  Margins/mediastinal lymph nodes negative. I reviewed the CT scan/PET scan patient in detail. I reviewed the pathology and stage with the patient in detail.    #I discussed the  modest risk of recurrence [20 to 30%] based on stage Ib/pathology.  Discussed the role of adjuvant chemotherapy.  For the absolute benefit at most would be about 5% from cisplatin-based chemotherapy.  Patient I do not think is a candidate for cisplatin based chemotherapy.  Carboplatin could still be substituted for cisplatin.  However, overall benefits of adjuvant chemotherapy are quite modest.  After lengthy discussion and after multiple questions which were appropriately answered-patient declined adjuvant chemotherapy.  I reviewed with the patient and husband that I think declining adjuvant therapy for stage Ib lung cancer is quite reasonable given the modest risk of recurrence and given overall benefit from adjuvant therapy.  Patient and husband in agreement.  #For now patient will follow-up with her surgeon; and also lung doctor for surveillance imaging etc. --------------------------------------  # Left upper lobe-lung cancer moderately differentiated squamous cell carcinoma-pT1pN1- [IIB]; PDL-1 =70%. Will check EGFR status-although low clinical possibility.  # I discussed the risk of recurrence-for stage II lung cancer-in the range of 30 to 40%;and the  benefit from adjuvant chemotherapy.  I discussed the goal of treatment would be cure.  Discussed that the absolute benefit from platinum based doublet chemotherapy is about 8 to 10%.  Based on PD-L1 status would also benefit from immunotherapy.     #Status adjuvant chemotherapy would include platinum [cisplatin or carboplatin] based on renal function in combination with Taxol.  Chemotherapy is recommended every 3 weeks x 4 cycles.    Discussed the potential side effects including but not limited to-increasing fatigue, nausea vomiting, diarrhea,  hair loss, sores in the mouth, increase risk of infection and also neuropathy.   # I also discussed the role of immunotherapy for stage II and above resected lung cancer.  Patient's PDL is 70%-recommend  Aztezoluzimab every 3 weeks for 16 cycles/ 1 year.    I discussed the mechanism of action. Discussed the potential side effects of immunotherapy including but not limited to diarrhea; skin rash; elevated LFTs/endocrine abnormalities etc.  #Discussed that patient will need surveillance imaging every 6 months or so; irrespective of patient's decision for adjuvant therapy.  #After lengthy discussion with the patient and daughter-they expressed interest in the treatment plan; however stated that they would need time to process information.  They will let us  know of their decision/will call [facility].  They are aware that I will be out of town for the next few weeks; and in my absence there could possibly see other providers to have a discussion; and plan is interested proceed with treatment plan.  Discussed with [facility].  # Mouth cancer s/p surgery- [facility] [2010]- no chemotherapy or radiation.   # ? Asthma - PFTs- [MAY 2025]- FVC Pre was 1.58 Liters, 67 % / Post 1.63, 69 %, +2 % Change  FEV1 was Pre 1.34, 73% of predicted/ Post 1.38, 75%, +2%Change  FEV1/FVC ratio was Pre 108 %/ Post 108 %,0% Change  FEF 25-75% liters per second was Pre 103 % of predicted/ Post 98  %, -5 % Change   # Prediabetes- [on glipizide] metformin- diarrhea   # DISPOSITION: # no labs today # PET scan ASAP # follow up TBD- Dr.B

## 2023-12-28 NOTE — Progress Notes (Unsigned)
 Patient here for initial oncology appointment, expresses  concerns of SOB with exertion, cough and chronic neuropathy

## 2024-01-01 ENCOUNTER — Ambulatory Visit
Admission: RE | Admit: 2024-01-01 | Discharge: 2024-01-01 | Disposition: A | Discharge status: Home / Self Care | Source: Ambulatory Visit | Attending: Internal Medicine | Admitting: Internal Medicine

## 2024-01-01 DIAGNOSIS — C3411 Malignant neoplasm of upper lobe, right bronchus or lung: Secondary | ICD-10-CM | POA: Insufficient documentation

## 2024-01-01 DIAGNOSIS — K8020 Calculus of gallbladder without cholecystitis without obstruction: Secondary | ICD-10-CM | POA: Diagnosis not present

## 2024-01-01 DIAGNOSIS — M47816 Spondylosis without myelopathy or radiculopathy, lumbar region: Secondary | ICD-10-CM | POA: Insufficient documentation

## 2024-01-01 DIAGNOSIS — M47817 Spondylosis without myelopathy or radiculopathy, lumbosacral region: Secondary | ICD-10-CM | POA: Diagnosis not present

## 2024-01-01 DIAGNOSIS — I700 Atherosclerosis of aorta: Secondary | ICD-10-CM | POA: Diagnosis not present

## 2024-01-01 LAB — GLUCOSE, CAPILLARY: Glucose-Capillary: 87 mg/dL (ref 70–99)

## 2024-01-01 MED ORDER — FLUDEOXYGLUCOSE F - 18 (FDG) INJECTION
13.0200 | Freq: Once | INTRAVENOUS | Status: AC | PRN
  Administered 2024-01-01: 13.02 via INTRAVENOUS

## 2024-01-07 ENCOUNTER — Encounter: Payer: Self-pay | Admitting: Emergency Medicine

## 2024-01-07 ENCOUNTER — Inpatient Hospital Stay

## 2024-01-07 ENCOUNTER — Encounter: Payer: Self-pay | Admitting: *Deleted

## 2024-01-07 DIAGNOSIS — C3411 Malignant neoplasm of upper lobe, right bronchus or lung: Secondary | ICD-10-CM

## 2024-01-07 NOTE — Progress Notes (Addendum)
 Tumor Board Documentation  Ashlee Miller was presented by myself at our Tumor Board on 01/07/2024, which included representatives from medical oncology, radiation oncology, navigation, palliative care, pulmonology, research, pharmacy, pathology, radiology.  [name] currently presents as a new patient, for discussion with history of the following treatments: none (biopsy, imaging, PFTs as part of workup).  Additionally, we reviewed previous medical and familial history, history of present illness, and recent lab results along with all available histopathologic and imaging studies. The tumor board considered available treatment options and made the following recommendations: Chemotherapy, Surgery, Radiation therapy (primary modality) Recommendation- Review of PFTs with Dr Aleskerov and determine if patient would be candidate for surgery. May be technically challenging based on location of lesion and result in pneumonectomy vs lobectomy. If she is felt not to be a candidate for surgery or not interested, we could consider radiation.   I discussed with Dr Jaclynn Mast- FEV1 75%. He agrees with recommendation to have her evaluated with (The proposed treatment discussed in conference is for discussion only and is not a binding recommendation. The patients have not been physically examined, or presented with their treatment options. Therefore, final treatment plans cannot be decided.)  The following procedures/referrals were also placed: No orders of the defined types were placed in this encounter.  Clinical Trial Status: not discussed   Staging used:  (early stage- no evidence of metastatic disease on imaging)  National site-specific guidelines   were discussed with respect to the case.  Tumor board is a meeting of clinicians from various specialty areas who evaluate and discuss patients for whom a multidisciplinary approach is being considered. Final determinations in the plan of care are those of the  provider(s). The responsibility for follow up of recommendations given during tumor board is that of the provider.   Today's extended care, comprehensive team conference, [place] was not present for the discussion and was not examined.

## 2024-01-07 NOTE — Progress Notes (Signed)
 Spoke with patient to review recommendations from tumor board. Informed that PET scan results show that there is no evidence of disease outside of the lung and that her options for treatment are surgery or radiation. Pt discussed with her husband and decided she wants to meet with the surgeon to discuss her surgical options. Informed pt that if not a candidate or declines surgery after discussing with the surgeon then will need to pursue radiation treatment. Contact info given to patient and instructed to call with any questions or needs. Pt verbalized understanding.   Referral placed to [facility] for further evaluation. Pt previously had PFT's at Dr. Wardell Guys office and those results will be scanned in for review.

## 2024-01-08 NOTE — Progress Notes (Signed)
 Received call from pt today stating that she has changed her mind and would like to proceed with radiation therapy. She considered both of her options and felt more comfortable pursuing radiation treatment. Pt requested to cancel her appt with the surgeon and would like a referral to meet with Dr. Jacalyn Martin. Message sent to surgeon's office to cancel appt and referral for radiation oncology has been placed. Informed pt to expect a call with her appt. Pt verbalized understanding.

## 2024-01-08 NOTE — Addendum Note (Signed)
 Addended by: Drake Gens on: 01/08/2024 02:03 PM   Modules accepted: Orders

## 2024-01-11 ENCOUNTER — Ambulatory Visit
Admission: RE | Admit: 2024-01-11 | Discharge: 2024-01-11 | Disposition: A | Discharge status: Home / Self Care | Source: Ambulatory Visit | Attending: Radiation Oncology | Admitting: Radiation Oncology

## 2024-01-11 ENCOUNTER — Encounter: Payer: Self-pay | Admitting: *Deleted

## 2024-01-11 ENCOUNTER — Encounter: Payer: Self-pay | Admitting: Radiation Oncology

## 2024-01-11 VITALS — BP 129/83 | HR 80 | Resp 20 | Wt 237.0 lb

## 2024-01-11 DIAGNOSIS — C3411 Malignant neoplasm of upper lobe, right bronchus or lung: Secondary | ICD-10-CM | POA: Insufficient documentation

## 2024-01-11 NOTE — Progress Notes (Signed)
 Met with patient during initial consult with Dr. Lenn. Pt did not have any needs or questions at this time. Instructed pt to call with any questions or needs. Pt verbalized understanding.

## 2024-01-11 NOTE — Consult Note (Signed)
 NEW PATIENT EVALUATION  Name: Ashlee Miller  MRN: [id number]  Date:   01/11/2024     DOB: December 20, 1950   This 73 y.o. female patient presents to the clinic for initial evaluation of clinical stage Ia (cT1a N0 M0) squamous cell carcinoma of the right upper lobe bronchus.  REFERRING PHYSICIAN: Rennie Cindy SAUNDERS, *  CHIEF COMPLAINT:  Chief Complaint  Patient presents with   Lung Cancer    DIAGNOSIS: The encounter diagnosis was Malignant neoplasm of right upper lobe of lung (HCC).   PREVIOUS INVESTIGATIONS:  PET/CT and CT scans reviewed Pathology reports reviewed Clinical notes reviewed  HPI: Patient is a 73 year old female who presented with increasing cough.  She was found on x-ray to have atelectasis of the right upper lobe with suspected right mainstem bronchus endobronchial lesion.  Patient is status post surgical treatment of oral carcinoma back in 2020 at [facility] with no adjuvant treatment.  She was seen by pulmonology underwent bronchoscopy which was positive for squamous cell carcinoma.  She was noted at the time of bronchoscopy to have adenopathy in the subcarinal and 2 station.  She also had an exophytic mass of the right upper lobe bronchus which was positive for squamous cell carcinoma.  Lymph node biopsy was negative for malignancy.  She underwent a PET CT scan showing hypermetabolic right upper lobe nodule near the right upper lobe bronchus compatible with malignancy.  No findings of metastatic disease or adenopathy in the mediastinum was noted.  Case was presented at our weekly tumor conference with recommendation for either surgical intervention or radiation therapy.  She has declined radiation and is now seen for radiation oncology consultation she is doing well her cough improved somewhat after bronchoscopy is now worsened again no hemoptysis her cough is clear.  She is having no pain.  PLANNED TREATMENT REGIMEN: Ultra hypofractionated radiation therapy  PAST MEDICAL HISTORY:   has a past medical history of Abnormal growth of clavicle (01/2018), Asthma, Atelectasis, Cancer (HCC) (2010), Dyspnea, Exposure to TB, Family history of adverse reaction to anesthesia, GERD (gastroesophageal reflux disease), History of seasonal allergies, HLD (hyperlipidemia), Hypertension, Hypothyroidism, Neuropathy, Pre-diabetes, and Sun allergy.    PAST SURGICAL HISTORY:  Past Surgical History:  Procedure Laterality Date   BREAST CYST ASPIRATION Right 1999   CARPAL TUNNEL RELEASE Bilateral 2006   COLONOSCOPY W/ POLYPECTOMY     MASS EXCISION Right 02/26/2018   Procedure: EXCISION OF MASS OF RIGHT CLAVICLE AREA;  Surgeon: Gladis Cough, MD;  Location: [facility] ORS;  Service: General;  Laterality: Right;   MULTIPLE TOOTH EXTRACTIONS  01/2018   alll teeth pulled at that time   soft tissue mass removed  2010   removed soft tissues in mouth for positive cancer   VIDEO BRONCHOSCOPY WITH ENDOBRONCHIAL NAVIGATION N/A 12/18/2023   Procedure: VIDEO BRONCHOSCOPY WITH ENDOBRONCHIAL NAVIGATION;  Surgeon: Parris Manna, MD;  Location: [facility] ORS;  Service: Thoracic;  Laterality: N/A;   VIDEO BRONCHOSCOPY WITH ENDOBRONCHIAL ULTRASOUND N/A 12/18/2023   Procedure: BRONCHOSCOPY, WITH EBUS;  Surgeon: Parris Manna, MD;  Location: [facility] ORS;  Service: Thoracic;  Laterality: N/A;    FAMILY HISTORY: family history includes Breast cancer (age of onset: 40) in her sister; Breast cancer (age of onset: 39) in her maternal aunt; Lung cancer in her brother.  SOCIAL HISTORY:  reports that she quit smoking about 15 years ago. Her smoking use included cigarettes. She has never used smokeless tobacco. She reports that she does not drink alcohol and does not use drugs.  ALLERGIES:  Penicillins, Ace inhibitors, and Fenofibrate  MEDICATIONS:  Current Outpatient Medications  Medication Sig Dispense Refill   Albuterol Sulfate (PROAIR RESPICLICK) 108 (90 Base) MCG/ACT AEPB Inhale 2 puffs into the lungs every 6 (six) hours  as needed.     amLODipine (NORVASC) 2.5 MG tablet Take 2.5 mg by mouth daily.     APPLE CIDER VINEGAR PO Take by mouth at bedtime. Gummy     aspirin EC 81 MG tablet Take 81 mg by mouth at bedtime.     atorvastatin (LIPITOR) 80 MG tablet Take 80 mg by mouth at bedtime.     B Complex-C (B-COMPLEX WITH VITAMIN C) tablet Take 1 tablet by mouth daily.     chlorthalidone (HYGROTON) 25 MG tablet Take 25 mg by mouth daily.     Cholecalciferol (VITAMIN D3) 125 MCG (5000 UT) TABS Take by mouth daily.     cyanocobalamin (VITAMIN B12) 500 MCG tablet Take 500 mcg by mouth daily.     fluticasone (FLONASE) 50 MCG/ACT nasal spray Place 1 spray into both nostrils as needed for allergies or rhinitis.     Fluticasone-Umeclidin-Vilant (TRELEGY ELLIPTA) 100-62.5-25 MCG/ACT AEPB Inhale 1 puff into the lungs daily.     gabapentin  (NEURONTIN ) 300 MG capsule Take 1,500 mg by mouth at bedtime.      glipiZIDE (GLUCOTROL XL) 10 MG 24 hr tablet Take 10 mg by mouth daily with breakfast.     ibuprofen (ADVIL) 400 MG tablet Take 400 mg by mouth every 6 (six) hours as needed.     levocetirizine (XYZAL) 5 MG tablet Take 5 mg by mouth daily.     levothyroxine (SYNTHROID, LEVOTHROID) 88 MCG tablet Take 88 mcg by mouth daily before breakfast.     losartan (COZAAR) 100 MG tablet Take 100 mg by mouth daily.     metoprolol tartrate (LOPRESSOR) 50 MG tablet Take 100 mg by mouth at bedtime.     montelukast (SINGULAIR) 10 MG tablet Take 10 mg by mouth at bedtime.     Multiple Vitamin (MULTIVITAMIN WITH MINERALS) TABS tablet Take 1 tablet by mouth daily. Women's multivitamin     Multiple Vitamins-Minerals (HAIR SKIN NAILS PO) Take by mouth daily.     omeprazole (PRILOSEC) 40 MG capsule Take 40 mg by mouth at bedtime.      potassium chloride (K-DUR) 10 MEQ tablet Take 10 mEq by mouth daily.     spironolactone (ALDACTONE) 25 MG tablet Take 25 mg by mouth daily.     zinc gluconate 50 MG tablet Take 50 mg by mouth daily.     MEDROL 4 MG  TBPK tablet take dose pak as directed on package: 6-5-4-3-2-1     Turmeric (QC TUMERIC COMPLEX) 500 MG CAPS Take by mouth. (Patient not taking: Reported on 01/11/2024)     No current facility-administered medications for this encounter.    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 Patient denies any weight loss, fatigue, weakness, fever, chills or night sweats. Patient denies any loss of vision, blurred vision. Patient denies any ringing  of the ears or hearing loss. No irregular heartbeat. Patient denies heart murmur or history of fainting. Patient denies any chest pain or pain radiating to her upper extremities. Patient denies any shortness of breath, difficulty breathing at night, cough or hemoptysis. Patient denies any swelling in the lower legs. Patient denies any  nausea vomiting, vomiting of blood, or coffee ground material in the vomitus. Patient denies any stomach pain. Patient states has had normal bowel movements no significant constipation or diarrhea. Patient denies any dysuria, hematuria or significant nocturia. Patient denies any problems walking, swelling in the joints or loss of balance. Patient denies any skin changes, loss of hair or loss of weight. Patient denies any excessive worrying or anxiety or significant depression. Patient denies any problems with insomnia. Patient denies excessive thirst, polyuria, polydipsia. Patient denies any swollen glands, patient denies easy bruising or easy bleeding. Patient denies any recent infections, allergies or URI. Patient s visual fields have not changed significantly in recent time.   PHYSICAL EXAM: BP 129/83   Pulse 80   Resp 20   Wt 237 lb (107.5 kg)   BMI 40.68 kg/m  Well-developed well-nourished patient in NAD. HEENT reveals PERLA, EOMI, discs not visualized.  Oral cavity is clear. No  oral mucosal lesions are identified. Neck is clear without evidence of cervical or supraclavicular adenopathy. Lungs are clear to A&P. Cardiac examination is essentially unremarkable with regular rate and rhythm without murmur rub or thrill. Abdomen is benign with no organomegaly or masses noted. Motor sensory and DTR levels are equal and symmetric in the upper and lower extremities. Cranial nerves II through XII are grossly intact. Proprioception is intact. No peripheral adenopathy or edema is identified. No motor or sensory levels are noted. Crude visual fields are within normal range.  LABORATORY DATA: Cytology pathology reports reviewed    RADIOLOGY RESULTS: CT scans PET CT scans reviewed compatible with above-stated findings   IMPRESSION: Stage I clinical squamous cell carcinoma of the right upper lobe bronchus in a 73 year old female  PLAN: At this time elect go ahead with ultra hypofractionated radiation therapy using IMRT treatment planning and delivery.  Structures close to main structures such as the heart spinal cord and esophagus.  I would plan on delivering 60 Gray in 15 fractions.  Risks and benefits of treatment including worsening cough possible skin reaction fatigue possible dysphagia from radiation esophagitis all reviewed in detail with the patient.  She comprehends her treatment plan well.  I have personally set up and ordered CT simulation for later this week.  Patient will not receive systemic treatment based on the stage I disease.  Patient comprehends my recommendations well.  I would like to take this opportunity to thank you for allowing me to participate in the care of your patient.[name], MD

## 2024-01-12 ENCOUNTER — Ambulatory Visit
Admission: RE | Admit: 2024-01-12 | Discharge: 2024-01-12 | Disposition: A | Discharge status: Home / Self Care | Source: Ambulatory Visit | Attending: Radiation Oncology | Admitting: Radiation Oncology

## 2024-01-12 ENCOUNTER — Encounter: Payer: Self-pay | Admitting: *Deleted

## 2024-01-12 DIAGNOSIS — C3411 Malignant neoplasm of upper lobe, right bronchus or lung: Secondary | ICD-10-CM | POA: Diagnosis not present

## 2024-01-14 ENCOUNTER — Other Ambulatory Visit: Payer: Self-pay | Admitting: *Deleted

## 2024-01-14 DIAGNOSIS — C3411 Malignant neoplasm of upper lobe, right bronchus or lung: Secondary | ICD-10-CM

## 2024-01-19 DIAGNOSIS — C3411 Malignant neoplasm of upper lobe, right bronchus or lung: Secondary | ICD-10-CM | POA: Insufficient documentation

## 2024-01-20 ENCOUNTER — Ambulatory Visit
Admission: RE | Admit: 2024-01-20 | Discharge: 2024-01-20 | Disposition: A | Discharge status: Home / Self Care | Source: Ambulatory Visit | Attending: Radiation Oncology | Admitting: Radiation Oncology

## 2024-01-20 DIAGNOSIS — C3411 Malignant neoplasm of upper lobe, right bronchus or lung: Secondary | ICD-10-CM | POA: Diagnosis not present

## 2024-01-25 ENCOUNTER — Ambulatory Visit
Admission: RE | Admit: 2024-01-25 | Discharge: 2024-01-25 | Disposition: A | Discharge status: Home / Self Care | Source: Ambulatory Visit | Attending: Radiation Oncology | Admitting: Radiation Oncology

## 2024-01-25 ENCOUNTER — Other Ambulatory Visit: Payer: Self-pay

## 2024-01-25 ENCOUNTER — Encounter: Payer: Self-pay | Admitting: *Deleted

## 2024-01-25 DIAGNOSIS — C3411 Malignant neoplasm of upper lobe, right bronchus or lung: Secondary | ICD-10-CM | POA: Diagnosis not present

## 2024-01-25 LAB — RAD ONC ARIA SESSION SUMMARY
Course Elapsed Days: 0
Plan Fractions Treated to Date: 1
Plan Prescribed Dose Per Fraction: 4 Gy
Plan Total Fractions Prescribed: 15
Plan Total Prescribed Dose: 60 Gy
Reference Point Dosage Given to Date: 4 Gy
Reference Point Session Dosage Given: 4 Gy
Session Number: 1

## 2024-01-26 ENCOUNTER — Other Ambulatory Visit: Payer: Self-pay

## 2024-01-26 ENCOUNTER — Ambulatory Visit
Admission: RE | Admit: 2024-01-26 | Discharge: 2024-01-26 | Disposition: A | Discharge status: Home / Self Care | Source: Ambulatory Visit | Attending: Radiation Oncology | Admitting: Radiation Oncology

## 2024-01-26 DIAGNOSIS — C3411 Malignant neoplasm of upper lobe, right bronchus or lung: Secondary | ICD-10-CM | POA: Diagnosis not present

## 2024-01-26 LAB — RAD ONC ARIA SESSION SUMMARY
Course Elapsed Days: 1
Plan Fractions Treated to Date: 2
Plan Prescribed Dose Per Fraction: 4 Gy
Plan Total Fractions Prescribed: 15
Plan Total Prescribed Dose: 60 Gy
Reference Point Dosage Given to Date: 8 Gy
Reference Point Session Dosage Given: 4 Gy
Session Number: 2

## 2024-01-27 ENCOUNTER — Other Ambulatory Visit: Payer: Self-pay

## 2024-01-27 ENCOUNTER — Ambulatory Visit
Admission: RE | Admit: 2024-01-27 | Discharge: 2024-01-27 | Disposition: A | Discharge status: Home / Self Care | Source: Ambulatory Visit | Attending: Radiation Oncology | Admitting: Radiation Oncology

## 2024-01-27 ENCOUNTER — Inpatient Hospital Stay

## 2024-01-27 DIAGNOSIS — Z87891 Personal history of nicotine dependence: Secondary | ICD-10-CM | POA: Insufficient documentation

## 2024-01-27 DIAGNOSIS — C3411 Malignant neoplasm of upper lobe, right bronchus or lung: Secondary | ICD-10-CM | POA: Insufficient documentation

## 2024-01-27 LAB — RAD ONC ARIA SESSION SUMMARY
Course Elapsed Days: 2
Plan Fractions Treated to Date: 3
Plan Prescribed Dose Per Fraction: 4 Gy
Plan Total Fractions Prescribed: 15
Plan Total Prescribed Dose: 60 Gy
Reference Point Dosage Given to Date: 12 Gy
Reference Point Session Dosage Given: 4 Gy
Session Number: 3

## 2024-01-27 LAB — CBC (CANCER CENTER ONLY)
HCT: 38.9 % (ref 36.0–46.0)
Hemoglobin: 12.9 g/dL (ref 12.0–15.0)
MCH: 28.2 pg (ref 26.0–34.0)
MCHC: 33.2 g/dL (ref 30.0–36.0)
MCV: 84.9 fL (ref 80.0–100.0)
Platelet Count: 220 K/uL (ref 150–400)
RBC: 4.58 MIL/uL (ref 3.87–5.11)
RDW: 13.5 % (ref 11.5–15.5)
WBC Count: 5.7 K/uL (ref 4.0–10.5)
nRBC: 0 % (ref 0.0–0.2)

## 2024-01-28 ENCOUNTER — Ambulatory Visit
Admission: RE | Admit: 2024-01-28 | Discharge: 2024-01-28 | Disposition: A | Discharge status: Home / Self Care | Source: Ambulatory Visit | Attending: Radiation Oncology | Admitting: Radiation Oncology

## 2024-01-28 ENCOUNTER — Other Ambulatory Visit: Payer: Self-pay

## 2024-01-28 DIAGNOSIS — C3411 Malignant neoplasm of upper lobe, right bronchus or lung: Secondary | ICD-10-CM | POA: Diagnosis not present

## 2024-01-28 LAB — RAD ONC ARIA SESSION SUMMARY
Course Elapsed Days: 3
Plan Fractions Treated to Date: 4
Plan Prescribed Dose Per Fraction: 4 Gy
Plan Total Fractions Prescribed: 15
Plan Total Prescribed Dose: 60 Gy
Reference Point Dosage Given to Date: 16 Gy
Reference Point Session Dosage Given: 4 Gy
Session Number: 4

## 2024-01-29 ENCOUNTER — Ambulatory Visit
Admission: RE | Admit: 2024-01-29 | Discharge: 2024-01-29 | Disposition: A | Discharge status: Home / Self Care | Source: Ambulatory Visit | Attending: Radiation Oncology | Admitting: Radiation Oncology

## 2024-01-29 ENCOUNTER — Encounter: Admitting: Thoracic Surgery (Cardiothoracic Vascular Surgery)

## 2024-01-29 ENCOUNTER — Other Ambulatory Visit: Payer: Self-pay

## 2024-01-29 DIAGNOSIS — C3411 Malignant neoplasm of upper lobe, right bronchus or lung: Secondary | ICD-10-CM | POA: Diagnosis not present

## 2024-01-29 LAB — RAD ONC ARIA SESSION SUMMARY
Course Elapsed Days: 4
Plan Fractions Treated to Date: 5
Plan Prescribed Dose Per Fraction: 4 Gy
Plan Total Fractions Prescribed: 15
Plan Total Prescribed Dose: 60 Gy
Reference Point Dosage Given to Date: 20 Gy
Reference Point Session Dosage Given: 4 Gy
Session Number: 5

## 2024-02-01 ENCOUNTER — Ambulatory Visit
Admission: RE | Admit: 2024-02-01 | Discharge: 2024-02-01 | Disposition: A | Discharge status: Home / Self Care | Source: Ambulatory Visit | Attending: Radiation Oncology | Admitting: Radiation Oncology

## 2024-02-01 ENCOUNTER — Other Ambulatory Visit: Payer: Self-pay

## 2024-02-01 DIAGNOSIS — C3411 Malignant neoplasm of upper lobe, right bronchus or lung: Secondary | ICD-10-CM | POA: Diagnosis not present

## 2024-02-01 LAB — RAD ONC ARIA SESSION SUMMARY
Course Elapsed Days: 7
Plan Fractions Treated to Date: 6
Plan Prescribed Dose Per Fraction: 4 Gy
Plan Total Fractions Prescribed: 15
Plan Total Prescribed Dose: 60 Gy
Reference Point Dosage Given to Date: 24 Gy
Reference Point Session Dosage Given: 4 Gy
Session Number: 6

## 2024-02-02 ENCOUNTER — Other Ambulatory Visit: Payer: Self-pay

## 2024-02-02 ENCOUNTER — Other Ambulatory Visit: Payer: Self-pay | Admitting: Family Medicine

## 2024-02-02 ENCOUNTER — Ambulatory Visit
Admission: RE | Admit: 2024-02-02 | Discharge: 2024-02-02 | Disposition: A | Discharge status: Home / Self Care | Source: Ambulatory Visit | Attending: Radiation Oncology | Admitting: Radiation Oncology

## 2024-02-02 DIAGNOSIS — Z1231 Encounter for screening mammogram for malignant neoplasm of breast: Secondary | ICD-10-CM

## 2024-02-02 DIAGNOSIS — C3411 Malignant neoplasm of upper lobe, right bronchus or lung: Secondary | ICD-10-CM | POA: Diagnosis not present

## 2024-02-02 LAB — RAD ONC ARIA SESSION SUMMARY
Course Elapsed Days: 8
Plan Fractions Treated to Date: 7
Plan Prescribed Dose Per Fraction: 4 Gy
Plan Total Fractions Prescribed: 15
Plan Total Prescribed Dose: 60 Gy
Reference Point Dosage Given to Date: 28 Gy
Reference Point Session Dosage Given: 4 Gy
Session Number: 7

## 2024-02-03 ENCOUNTER — Ambulatory Visit
Admission: RE | Admit: 2024-02-03 | Discharge: 2024-02-03 | Disposition: A | Discharge status: Home / Self Care | Source: Ambulatory Visit | Attending: Family Medicine | Admitting: Family Medicine

## 2024-02-03 ENCOUNTER — Ambulatory Visit

## 2024-02-03 ENCOUNTER — Ambulatory Visit
Admission: RE | Admit: 2024-02-03 | Discharge: 2024-02-03 | Disposition: A | Discharge status: Home / Self Care | Source: Ambulatory Visit | Attending: Radiation Oncology | Admitting: Radiation Oncology

## 2024-02-03 ENCOUNTER — Inpatient Hospital Stay

## 2024-02-03 ENCOUNTER — Other Ambulatory Visit: Payer: Self-pay

## 2024-02-03 DIAGNOSIS — C3411 Malignant neoplasm of upper lobe, right bronchus or lung: Secondary | ICD-10-CM

## 2024-02-03 DIAGNOSIS — Z1231 Encounter for screening mammogram for malignant neoplasm of breast: Secondary | ICD-10-CM | POA: Diagnosis present

## 2024-02-03 LAB — CBC (CANCER CENTER ONLY)
HCT: 40.4 % (ref 36.0–46.0)
Hemoglobin: 13.2 g/dL (ref 12.0–15.0)
MCH: 27.9 pg (ref 26.0–34.0)
MCHC: 32.7 g/dL (ref 30.0–36.0)
MCV: 85.4 fL (ref 80.0–100.0)
Platelet Count: 231 K/uL (ref 150–400)
RBC: 4.73 MIL/uL (ref 3.87–5.11)
RDW: 14.1 % (ref 11.5–15.5)
WBC Count: 6.1 K/uL (ref 4.0–10.5)
nRBC: 0 % (ref 0.0–0.2)

## 2024-02-03 LAB — RAD ONC ARIA SESSION SUMMARY
Course Elapsed Days: 9
Plan Fractions Treated to Date: 8
Plan Prescribed Dose Per Fraction: 4 Gy
Plan Total Fractions Prescribed: 15
Plan Total Prescribed Dose: 60 Gy
Reference Point Dosage Given to Date: 32 Gy
Reference Point Session Dosage Given: 4 Gy
Session Number: 8

## 2024-02-04 ENCOUNTER — Other Ambulatory Visit: Payer: Self-pay

## 2024-02-04 ENCOUNTER — Ambulatory Visit
Admission: RE | Admit: 2024-02-04 | Discharge: 2024-02-04 | Disposition: A | Discharge status: Home / Self Care | Source: Ambulatory Visit | Attending: Radiation Oncology | Admitting: Radiation Oncology

## 2024-02-04 DIAGNOSIS — C3411 Malignant neoplasm of upper lobe, right bronchus or lung: Secondary | ICD-10-CM | POA: Diagnosis not present

## 2024-02-04 LAB — RAD ONC ARIA SESSION SUMMARY
Course Elapsed Days: 10
Plan Fractions Treated to Date: 9
Plan Prescribed Dose Per Fraction: 4 Gy
Plan Total Fractions Prescribed: 15
Plan Total Prescribed Dose: 60 Gy
Reference Point Dosage Given to Date: 36 Gy
Reference Point Session Dosage Given: 4 Gy
Session Number: 9

## 2024-02-05 ENCOUNTER — Ambulatory Visit
Admission: RE | Admit: 2024-02-05 | Discharge: 2024-02-05 | Disposition: A | Discharge status: Home / Self Care | Source: Ambulatory Visit | Attending: Radiation Oncology | Admitting: Radiation Oncology

## 2024-02-05 ENCOUNTER — Other Ambulatory Visit: Payer: Self-pay

## 2024-02-05 DIAGNOSIS — C3411 Malignant neoplasm of upper lobe, right bronchus or lung: Secondary | ICD-10-CM | POA: Diagnosis not present

## 2024-02-05 LAB — RAD ONC ARIA SESSION SUMMARY
Course Elapsed Days: 11
Plan Fractions Treated to Date: 10
Plan Prescribed Dose Per Fraction: 4 Gy
Plan Total Fractions Prescribed: 15
Plan Total Prescribed Dose: 60 Gy
Reference Point Dosage Given to Date: 40 Gy
Reference Point Session Dosage Given: 4 Gy
Session Number: 10

## 2024-02-08 ENCOUNTER — Other Ambulatory Visit: Payer: Self-pay

## 2024-02-08 ENCOUNTER — Ambulatory Visit
Admission: RE | Admit: 2024-02-08 | Discharge: 2024-02-08 | Disposition: A | Discharge status: Home / Self Care | Source: Ambulatory Visit | Attending: Radiation Oncology | Admitting: Radiation Oncology

## 2024-02-08 DIAGNOSIS — C3411 Malignant neoplasm of upper lobe, right bronchus or lung: Secondary | ICD-10-CM | POA: Diagnosis not present

## 2024-02-08 LAB — RAD ONC ARIA SESSION SUMMARY
Course Elapsed Days: 14
Plan Fractions Treated to Date: 11
Plan Prescribed Dose Per Fraction: 4 Gy
Plan Total Fractions Prescribed: 15
Plan Total Prescribed Dose: 60 Gy
Reference Point Dosage Given to Date: 44 Gy
Reference Point Session Dosage Given: 4 Gy
Session Number: 11

## 2024-02-09 ENCOUNTER — Ambulatory Visit
Admission: RE | Admit: 2024-02-09 | Discharge: 2024-02-09 | Discharge status: Home / Self Care | Source: Ambulatory Visit | Attending: Radiation Oncology | Admitting: Radiation Oncology

## 2024-02-09 ENCOUNTER — Other Ambulatory Visit: Payer: Self-pay

## 2024-02-09 DIAGNOSIS — C3411 Malignant neoplasm of upper lobe, right bronchus or lung: Secondary | ICD-10-CM | POA: Diagnosis not present

## 2024-02-09 LAB — RAD ONC ARIA SESSION SUMMARY
Course Elapsed Days: 15
Plan Fractions Treated to Date: 12
Plan Prescribed Dose Per Fraction: 4 Gy
Plan Total Fractions Prescribed: 15
Plan Total Prescribed Dose: 60 Gy
Reference Point Dosage Given to Date: 48 Gy
Reference Point Session Dosage Given: 4 Gy
Session Number: 12

## 2024-02-10 ENCOUNTER — Inpatient Hospital Stay

## 2024-02-10 ENCOUNTER — Other Ambulatory Visit: Payer: Self-pay

## 2024-02-10 ENCOUNTER — Ambulatory Visit
Admission: RE | Admit: 2024-02-10 | Discharge: 2024-02-10 | Disposition: A | Discharge status: Home / Self Care | Source: Ambulatory Visit | Attending: Radiation Oncology | Admitting: Radiation Oncology

## 2024-02-10 DIAGNOSIS — C3411 Malignant neoplasm of upper lobe, right bronchus or lung: Secondary | ICD-10-CM

## 2024-02-10 LAB — RAD ONC ARIA SESSION SUMMARY
Course Elapsed Days: 16
Plan Fractions Treated to Date: 13
Plan Prescribed Dose Per Fraction: 4 Gy
Plan Total Fractions Prescribed: 15
Plan Total Prescribed Dose: 60 Gy
Reference Point Dosage Given to Date: 52 Gy
Reference Point Session Dosage Given: 4 Gy
Session Number: 13

## 2024-02-10 LAB — CBC (CANCER CENTER ONLY)
HCT: 38.6 % (ref 36.0–46.0)
Hemoglobin: 12.6 g/dL (ref 12.0–15.0)
MCH: 27.9 pg (ref 26.0–34.0)
MCHC: 32.6 g/dL (ref 30.0–36.0)
MCV: 85.6 fL (ref 80.0–100.0)
Platelet Count: 221 K/uL (ref 150–400)
RBC: 4.51 MIL/uL (ref 3.87–5.11)
RDW: 14 % (ref 11.5–15.5)
WBC Count: 5.2 K/uL (ref 4.0–10.5)
nRBC: 0 % (ref 0.0–0.2)

## 2024-02-11 ENCOUNTER — Other Ambulatory Visit: Payer: Self-pay

## 2024-02-11 ENCOUNTER — Encounter: Payer: Self-pay | Admitting: *Deleted

## 2024-02-11 ENCOUNTER — Ambulatory Visit
Admission: RE | Admit: 2024-02-11 | Discharge: 2024-02-11 | Disposition: A | Discharge status: Home / Self Care | Source: Ambulatory Visit | Attending: Radiation Oncology | Admitting: Radiation Oncology

## 2024-02-11 DIAGNOSIS — C3411 Malignant neoplasm of upper lobe, right bronchus or lung: Secondary | ICD-10-CM | POA: Diagnosis not present

## 2024-02-11 LAB — RAD ONC ARIA SESSION SUMMARY
Course Elapsed Days: 17
Plan Fractions Treated to Date: 14
Plan Prescribed Dose Per Fraction: 4 Gy
Plan Total Fractions Prescribed: 15
Plan Total Prescribed Dose: 60 Gy
Reference Point Dosage Given to Date: 56 Gy
Reference Point Session Dosage Given: 4 Gy
Session Number: 14

## 2024-02-12 ENCOUNTER — Ambulatory Visit
Admission: RE | Admit: 2024-02-12 | Discharge: 2024-02-12 | Disposition: A | Discharge status: Home / Self Care | Source: Ambulatory Visit | Attending: Radiation Oncology | Admitting: Radiation Oncology

## 2024-02-12 ENCOUNTER — Other Ambulatory Visit: Payer: Self-pay

## 2024-02-12 DIAGNOSIS — C3411 Malignant neoplasm of upper lobe, right bronchus or lung: Secondary | ICD-10-CM | POA: Diagnosis not present

## 2024-02-12 LAB — RAD ONC ARIA SESSION SUMMARY
Course Elapsed Days: 18
Plan Fractions Treated to Date: 15
Plan Prescribed Dose Per Fraction: 4 Gy
Plan Total Fractions Prescribed: 15
Plan Total Prescribed Dose: 60 Gy
Reference Point Dosage Given to Date: 60 Gy
Reference Point Session Dosage Given: 4 Gy
Session Number: 15

## 2024-02-15 NOTE — Radiation Completion Notes (Signed)
 Patient Name: Ashlee Miller, Ashlee Miller MRN: [id number] Date of Birth: Apr 05, 1951 Referring Physician: CINDY JOE, M.D. Date of Service: 2024-02-15 Radiation Oncologist: Marcey Penton, M.D. [facility]                             RADIATION ONCOLOGY END OF TREATMENT NOTE     Diagnosis: C34.00 Malignant neoplasm of unspecified main bronchus Staging on 2023-12-30: Malignant neoplasm of right upper lobe of lung (HCC) T=cT1a, N=cN0, M=cM0 Intent: Curative     HPI: Patient is a 73 year old female who presented with increasing cough.  She was found on x-ray to have atelectasis of the right upper lobe with suspected right mainstem bronchus endobronchial lesion.  Patient is status post surgical treatment of oral carcinoma back in 2020 at [facility] with no adjuvant treatment.  She was seen by pulmonology underwent bronchoscopy which was positive for squamous cell carcinoma.  She was noted at the time of bronchoscopy to have adenopathy in the subcarinal and 2 station.  She also had an exophytic mass of the right upper lobe bronchus which was positive for squamous cell carcinoma.  Lymph node biopsy was negative for malignancy.  She underwent a PET CT scan showing hypermetabolic right upper lobe nodule near the right upper lobe bronchus compatible with malignancy.  No findings of metastatic disease or adenopathy in the mediastinum was noted.  Case was presented at our weekly tumor conference with recommendation for either surgical intervention or radiation therapy.  She has declined radiation and is now seen for radiation oncology consultation she is doing well her cough improved somewhat after bronchoscopy is now worsened again no hemoptysis her cough is clear.  She is having no pain.      ==========DELIVERED PLANS==========  First Treatment Date: 2024-01-25 Last Treatment Date: 2024-02-12   Plan Name: [id number] Site: Lung, Right Technique: IMRT Mode: Photon Dose Per Fraction: 4  Gy Prescribed Dose (Delivered / Prescribed): 60 Gy / 60 Gy Prescribed Fxs (Delivered / Prescribed): 15 / 15     ==========ON TREATMENT VISIT DATES========== 2024-01-26, 2024-02-02, 2024-02-09     ==========UPCOMING VISITS========== 03/17/2024 [facility] RAD ONCOLOGY FOLLOW UP 30 Chrystal, Glenn, MD        ==========APPENDIX - ON TREATMENT VISIT NOTES==========   See weekly On Treatment Notes in Epic for details in the Media tab (listed as Progress notes on the On Treatment Visit Dates listed above).

## 2024-03-01 NOTE — Progress Notes (Signed)
 Foot and Ankle Surgery Patient Visit  Chief Complaint:   Chief Complaint  Patient presents with  . New Patient    Bilateral foot pain     Subjective  HPI  Ashlee Miller is a 73 y.o. female who presents for New Patient (Bilateral foot pain )  Patient does have a history of neuropathy and has seen neurology in the past but it has been several years ago.  She apparently had EMG studies in 2019 showing neuropathy in both legs.  She has previously taken gabapentin  in the past and there was some discussion about adding Lyrica, Cymbalta, and nortriptyline.  Patient was also advised on topical therapies. History of Present Illness Ashlee Miller is a 73 year old female with polyneuropathy who presents with burning, tingling, and numbness in bilateral extremities.  She experiences burning, tingling sensations, and numbness in both legs, with radiating pain from the toes to the lower legs. Pain is rated as seven out of ten today. She has a history of polyneuropathy diagnosed by neurology in 2019, confirmed by EMG studies. Current medication includes gabapentin , 300 mg in the morning and 1500 mg at night, which causes drowsiness and limits her ability to drive.  She has tried various topical therapies, including Nervane and other over-the-counter creams. She wears compression stockings and describes her feet as 'dark underneath.'  Family history is significant for diabetes, as her mother and all of her mother's sisters were diabetic. She has been prediabetic since her twenties but has not been diagnosed with diabetes. Her mother also experienced foot pain.  She experiences difficulty with balance and coordination, affecting her ability to bowl and requiring her to change her approach due to decreased sensation in her feet. No back problems are reported.  Patient Active Problem List  Diagnosis  . Neuropathy  . Asthma (HHS-HCC)  . Cough  . Chronic sinusitis  . Hypothyroidism  . Disturbance of skin  sensation  . Obesity  . Hx of hypokalemia  . Hyperlipidemia  . Hypertension  . Obesity (BMI 30-39.9), unspecified  . Polyneuropathy  . Prediabetes    Allergies  Allergen Reactions  . Penicillins Rash  . Ace Inhibitors Cough  . Fenofibrate Itching    Social Drivers of Health   Tobacco Use: Medium Risk (03/02/2024)   Patient History   . Smoking Tobacco Use: Former   . Smokeless Tobacco Use: Never   . Passive Exposure: Not on file  Alcohol Use: Not on file  Financial Resource Strain: High Risk (11/30/2023)   Overall Financial Resource Strain (CARDIA)   . Difficulty of Paying Living Expenses: Hard  Food Insecurity: No Food Insecurity (01/12/2024)   Received from [facility]   Hunger Vital Sign   . Within the past 12 months, you worried that your food would run out before you got the money to buy more.: Never true   . Within the past 12 months, the food you bought just didn't last and you didn't have money to get more.: Never true  Transportation Needs: No Transportation Needs (01/12/2024)   Received from [facility] - Transportation   . In the past 12 months, has lack of transportation kept you from medical appointments or from getting medications?: No   . In the past 12 months, has lack of transportation kept you from meetings, work, or from getting things needed for daily living?: No  Physical Activity: Not on file  Stress: Not on file  Social Connections: Not on file  Depression: Not on file  Housing Stability: Low Risk  (11/30/2023)   Housing Stability Vital Sign   . Unable to Pay for Housing in the Last Year: No   . Number of Times Moved in the Last Year: 0   . Homeless in the Last Year: No  Utilities: Not At Risk (01/12/2024)   Received from [facility] Utilities   . In the past 12 months has the electric, gas, oil, or water company threatened to shut off services in your home?: No  Health Literacy: Not on file    Outpatient Medications Prior to Visit   Medication Sig Dispense Refill  . *multivitamins oral     . albuterol MDI, PROVENTIL, VENTOLIN, PROAIR, HFA 90 mcg/actuation inhaler Inhale 2 inhalations into the lungs every 4 (four) hours as needed for Wheezing 1 each 5  . amLODIPine (NORVASC) 2.5 MG tablet Take 1 tablet (2.5 mg total) by mouth once daily    . ascorbic acid/vitamin E/biotin (HAIR, SKIN, NAILS WITH BIOTIN ORAL) Take 5,000 mcg by mouth every morning    . aspirin 81 MG EC tablet Take 1 tablet (81 mg total) by mouth once daily    . atorvastatin (LIPITOR) 80 MG tablet Take 80 mg by mouth once daily    . chlorthalidone 25 MG tablet Take 1 tablet (25 mg total) by mouth once daily    . CHOLECALCIFEROL, VITAMIN D3, ORAL Take 5,000 Units by mouth once daily    . fluticasone propionate (FLONASE) 50 mcg/actuation nasal spray     . gabapentin  (NEURONTIN ) 300 MG capsule Take 1 tab in the morning and 4 tablets at night (Patient taking differently: Take 300 mg by mouth Take 1 tab in the morning and 5 tablets at night) 450 capsule 1  . glipiZIDE (GLUCOTROL) 10 MG tablet Take 1 tablet (10 mg total) by mouth every morning before breakfast    . levocetirizine (XYZAL) 5 MG tablet Take 1 tablet (5 mg total) by mouth every evening    . levothyroxine (SYNTHROID, LEVOTHROID) 88 MCG tablet Take 88 mcg by mouth once daily Take on an empty stomach with a glass of water at least 30-60 minutes before breakfast.    . losartan (COZAAR) 100 MG tablet     . metoprolol succinate (TOPROL-XL) 100 MG XL tablet Take 1 tablet (100 mg total) by mouth once daily    . montelukast (SINGULAIR) 10 mg tablet Take 10 mg by mouth nightly    . omeprazole (PRILOSEC) 40 MG DR capsule Take 40 mg by mouth once daily    . potassium chloride (KLOR-CON M20) 20 MEQ ER tablet     . predniSONE (DELTASONE) 10 MG tablet 20mg  (two tabs) x 5 days, 10mg  (one tab) x 5 days 15 tablet 0  . predniSONE (DELTASONE) 5 MG tablet Take 1 tablet (5 mg total) by mouth once daily 30 tablet 0  .  spironolactone (ALDACTONE) 25 MG tablet Take 1 tablet (25 mg total) by mouth once daily    . TURMERIC ORAL Take 1,500 mg by mouth once daily    . vitamin B complex (B COMPLEX 1 ORAL) Take by mouth every morning    . zinc 50 mg Tab Take by mouth     No facility-administered medications prior to visit.      Objective  Vitals:   03/02/24 1354  Weight: (!) 105.2 kg (232 lb)  Height: 162.6 cm (5' 4)  PainSc:   7  PainLoc: Foot   Body mass index is 39.82  kg/m.  Home Vitals:     Physical Exam Physical Exam SKIN: Dark spots on feet, likely hemosiderin deposits.  General/Constitutional: No apparent distress: well-nourished and well developed.   Psych: Normal mood and affect, oriented to person, place and time (AOx3)   Vascular: DP/PT pulses palpable bilateral, capillary fill time intact to digits bilaterally, hair growth present to digits of bilateral lower extremities.  Mild bilateral lower extremity nonpitting edema present.  Hemosiderin deposits present to bilateral lower extremities.   Neuro: Light touch sensation reduced bilateral lower extremities.   Derm: No open lesions or ulcerations present to bilateral lower extremities.   MSK: 5/5 muscle strength bilateral lower extremity muscle groups.  Results EMG: Bilateral lower extremity neuropathy (2019)     Assessment/Plan:    Assessment & Plan Polyneuropathy of bilateral lower extremities Chronic polyneuropathy with burning, tingling, numbness, and radiating pain. EMG confirmed neuropathy. Gabapentin  dosage intermediate; increase limited by drowsiness. Etiology unclear, no diabetes, back issues, chemotherapy, or alcohol use. - Discussed medication management including gabapentin , Lyrica, amitriptyline, topical compounding cream for neuropathic pain. -Patient already uses gabapentin  as well as topical compound cream and does take vitamin D. - Discussed use of capsaicin cream topically 2-3 times a day. - Consider  multivitamin therapy with B12 and alpha lipoic acid 600 mg once a day.  Patient may obtain over-the-counter. - Consider referral to pain management for further evaluation and potential treatments such as Cutenza wrap or spinal cord stimulator.  Patient declines currently.  Chronic venous insufficiency with lower extremity skin changes Chronic venous insufficiency with skin discoloration likely from hemosiderin deposits due to venous stasis. Circulation adequate with palpable pulses. - Continue use of compression stockings to manage venous insufficiency.  Diagnoses and all orders for this visit:  Polyneuropathy  Foot pain, bilateral    Return if symptoms worsen or fail to improve.  An after visit summary was provided for the patient either in written format (printed) or through MyChart if needed/necessary .  This note has been created using automated tools and reviewed for accuracy by ANDREW MICHAEL BAKER.  Translation done by AI may create words close to the sound of words used at visit, but errors could be present.  Note was reviewed.

## 2024-03-17 ENCOUNTER — Ambulatory Visit
Admission: RE | Admit: 2024-03-17 | Discharge: 2024-03-17 | Discharge status: Home / Self Care | Source: Ambulatory Visit | Attending: Radiation Oncology | Admitting: Radiation Oncology

## 2024-03-17 ENCOUNTER — Encounter: Payer: Self-pay | Admitting: Radiation Oncology

## 2024-03-17 ENCOUNTER — Other Ambulatory Visit: Payer: Self-pay | Admitting: *Deleted

## 2024-03-17 VITALS — BP 117/78 | HR 80 | Temp 98.5°F | Resp 20 | Wt 235.0 lb

## 2024-03-17 DIAGNOSIS — C3411 Malignant neoplasm of upper lobe, right bronchus or lung: Secondary | ICD-10-CM | POA: Diagnosis present

## 2024-03-17 NOTE — Progress Notes (Signed)
 Radiation Oncology Follow up Note  Name: Ashlee Miller   Date:   03/17/2024 MRN:  [id number] DOB: Sep 25, 1950    This 73 y.o. female presents to the clinic today for 1 month follow-up status post ultra hypofractionated radiation therapy using IMRT to her right lung for stage I squamous cell carcinoma.  REFERRING PROVIDER: Buren Rock HERO, MD  HPI: Patient is a 73 year old female now out 1 month having completed ultra hypofractionated radiation therapy to her right upper lobe for a stage I squamous cell carcinoma seen today in routine follow-up she is doing well specifically denies hemoptysis chest tightness or any dysphagia..  She does have a cough with some thick secretions and I have recommended Mucinex for that.  COMPLICATIONS OF TREATMENT: none  FOLLOW UP COMPLIANCE: keeps appointments   PHYSICAL EXAM:  BP 117/78   Pulse 80   Temp 98.5 F (36.9 C)   Resp 20   Wt 235 lb (106.6 kg)   BMI 40.34 kg/m  Well-developed well-nourished patient in NAD. HEENT reveals PERLA, EOMI, discs not visualized.  Oral cavity is clear. No oral mucosal lesions are identified. Neck is clear without evidence of cervical or supraclavicular adenopathy. Lungs are clear to A&P. Cardiac examination is essentially unremarkable with regular rate and rhythm without murmur rub or thrill. Abdomen is benign with no organomegaly or masses noted. Motor sensory and DTR levels are equal and symmetric in the upper and lower extremities. Cranial nerves II through XII are grossly intact. Proprioception is intact. No peripheral adenopathy or edema is identified. No motor or sensory levels are noted. Crude visual fields are within normal range.  RADIOLOGY RESULTS: CT scan of the chest ordered in 3 months  PLAN: At the present time patient is doing well 1 month out from hypofractionated radiation therapy for stage I squamous cell carcinoma the right upper lobe.  And pleased with her overall progress.  I have ordered a CT scan of 3  months and a follow-up visit shortly thereafter.  Patient knows to contact me for any problems in the near future.  I would like to take this opportunity to thank you for allowing me to participate in the care of your patient.Ashlee Marcey Penton, MD

## 2024-06-03 ENCOUNTER — Telehealth: Payer: Self-pay | Admitting: Internal Medicine

## 2024-06-03 NOTE — Telephone Encounter (Signed)
 Called pt to confirm CT appt - gave pt address of [facility] and appt info again - said she had forgotten about appt - confirmed that date/time/location were still good w/pt - LH

## 2024-06-07 ENCOUNTER — Telehealth: Payer: Self-pay | Admitting: Internal Medicine

## 2024-06-07 NOTE — Telephone Encounter (Signed)
 Received a vm from pt saying she had a question regarding her upcoming CT - called pt back - no answer - left vm with appt info and asked her to call me back if she had any other questions - LH

## 2024-06-09 ENCOUNTER — Ambulatory Visit
Admission: RE | Admit: 2024-06-09 | Discharge: 2024-06-09 | Disposition: A | Discharge status: Home / Self Care | Source: Ambulatory Visit | Attending: Radiation Oncology | Admitting: Radiation Oncology

## 2024-06-09 DIAGNOSIS — C3411 Malignant neoplasm of upper lobe, right bronchus or lung: Secondary | ICD-10-CM | POA: Diagnosis present

## 2024-06-09 MED ORDER — IOHEXOL 300 MG/ML  SOLN
75.0000 mL | Freq: Once | INTRAMUSCULAR | Status: AC | PRN
  Administered 2024-06-09: 75 mL via INTRAVENOUS

## 2024-06-22 ENCOUNTER — Encounter: Payer: Self-pay | Admitting: Internal Medicine

## 2024-06-22 ENCOUNTER — Telehealth: Payer: Self-pay | Admitting: Radiation Oncology

## 2024-06-22 ENCOUNTER — Other Ambulatory Visit: Payer: Self-pay | Admitting: *Deleted

## 2024-06-22 ENCOUNTER — Ambulatory Visit
Admission: RE | Admit: 2024-06-22 | Discharge: 2024-06-22 | Attending: Radiation Oncology | Admitting: Radiation Oncology

## 2024-06-22 ENCOUNTER — Inpatient Hospital Stay: Attending: Internal Medicine | Admitting: Internal Medicine

## 2024-06-22 ENCOUNTER — Encounter: Payer: Self-pay | Admitting: Radiation Oncology

## 2024-06-22 VITALS — BP 118/73 | HR 83 | Temp 96.6°F | Resp 24 | Ht 64.0 in | Wt 232.1 lb

## 2024-06-22 DIAGNOSIS — Z880 Allergy status to penicillin: Secondary | ICD-10-CM | POA: Diagnosis not present

## 2024-06-22 DIAGNOSIS — Z803 Family history of malignant neoplasm of breast: Secondary | ICD-10-CM | POA: Insufficient documentation

## 2024-06-22 DIAGNOSIS — Z85819 Personal history of malignant neoplasm of unspecified site of lip, oral cavity, and pharynx: Secondary | ICD-10-CM | POA: Diagnosis not present

## 2024-06-22 DIAGNOSIS — C3411 Malignant neoplasm of upper lobe, right bronchus or lung: Secondary | ICD-10-CM

## 2024-06-22 DIAGNOSIS — Z923 Personal history of irradiation: Secondary | ICD-10-CM | POA: Insufficient documentation

## 2024-06-22 DIAGNOSIS — Z79899 Other long term (current) drug therapy: Secondary | ICD-10-CM | POA: Diagnosis not present

## 2024-06-22 DIAGNOSIS — Z801 Family history of malignant neoplasm of trachea, bronchus and lung: Secondary | ICD-10-CM | POA: Insufficient documentation

## 2024-06-22 DIAGNOSIS — E039 Hypothyroidism, unspecified: Secondary | ICD-10-CM | POA: Insufficient documentation

## 2024-06-22 DIAGNOSIS — Z7952 Long term (current) use of systemic steroids: Secondary | ICD-10-CM | POA: Diagnosis not present

## 2024-06-22 DIAGNOSIS — E785 Hyperlipidemia, unspecified: Secondary | ICD-10-CM | POA: Diagnosis not present

## 2024-06-22 DIAGNOSIS — Z7984 Long term (current) use of oral hypoglycemic drugs: Secondary | ICD-10-CM | POA: Diagnosis not present

## 2024-06-22 DIAGNOSIS — Z87891 Personal history of nicotine dependence: Secondary | ICD-10-CM | POA: Diagnosis not present

## 2024-06-22 DIAGNOSIS — R059 Cough, unspecified: Secondary | ICD-10-CM | POA: Insufficient documentation

## 2024-06-22 DIAGNOSIS — I10 Essential (primary) hypertension: Secondary | ICD-10-CM | POA: Diagnosis not present

## 2024-06-22 DIAGNOSIS — Z7982 Long term (current) use of aspirin: Secondary | ICD-10-CM | POA: Insufficient documentation

## 2024-06-22 DIAGNOSIS — R062 Wheezing: Secondary | ICD-10-CM | POA: Insufficient documentation

## 2024-06-22 MED ORDER — TRELEGY ELLIPTA 100-62.5-25 MCG/ACT IN AEPB: 1.0000 | INHALATION_SPRAY | Freq: Every day | RESPIRATORY_TRACT | 2 refills | Status: DC

## 2024-06-22 NOTE — Progress Notes (Signed)
 CT chest 06/09/24.  C/o having a cough for over a year now. Nothing she has tried for the cough is helping. Having sob that has gotten better since radiation. She states that at times she coughs so long she feels like she is going to pass out/lightheaded.  Pt would like a printed rx for her trelegy if possible. Pended to print.

## 2024-06-22 NOTE — Progress Notes (Signed)
 Radiation Oncology Follow up Note  Name: Ashlee Miller   Date:   06/22/2024 MRN:  [id number] DOB: [birth year]    This 73 y.o. female presents to the clinic today for 84-month follow-up status post ultra hypofractionated radiation therapy to her right lung for stage I squamous cell carcinoma.  REFERRING PROVIDER: Buren Rock HERO, MD  HPI: Patient is a 73 year old female now out 4 months having completed ultra hypofractionated radiation therapy for stage I squamous cell carcinoma the right lung.  Seen today in routine follow-up she is doing well.  She still has a mild cough she is using Mucinex for that.  She also continues to have some wheezing.  She had a recent CT scan which I have reviewed.  Showing the previous right upper lobe endobronchial obstruction no longer well-seen there is some central right upper lobe airway thickening no evidence of metastatic or progressive disease.  COMPLICATIONS OF TREATMENT: none  FOLLOW UP COMPLIANCE: keeps appointments   PHYSICAL EXAM:  There were no vitals taken for this visit.  Patient does have some mild wheezing mostly of the right chest. Well-developed well-nourished patient in NAD. HEENT reveals PERLA, EOMI, discs not visualized.  Oral cavity is clear. No oral mucosal lesions are identified. Neck is clear without evidence of cervical or supraclavicular adenopathy. Lungs are clear to A&P. Cardiac examination is essentially unremarkable with regular rate and rhythm without murmur rub or thrill. Abdomen is benign with no organomegaly or masses noted. Motor sensory and DTR levels are equal and symmetric in the upper and lower extremities. Cranial nerves II through XII are grossly intact. Proprioception is intact. No peripheral adenopathy or edema is identified. No motor or sensory levels are noted. Crude visual fields are within normal range.  RADIOLOGY RESULTS: CT scan reviewed compatible with above-stated findings  PLAN: Present time patient is doing well  excellent response to therapy by CT criteria I am pleased with her overall progress.  Of asked to see her back in 6 months with a repeat CT scan of her chest.  Patient knows to call with any concerns at any time.  She will continue on Mucinex.  I would like to take this opportunity to thank you for allowing me to participate in the care of your patient.Ashlee Marcey Penton, MD

## 2024-06-22 NOTE — Telephone Encounter (Signed)
 Called pt to schedule CT for end of May - left vm for call back - LH

## 2024-06-22 NOTE — Progress Notes (Signed)
 [facility] CONSULT NOTE  Patient Care Team: Buren Rock HERO, MD as PCP - General (Family Medicine) Verdene Gills, RN as Oncology Nurse Navigator Rennie Cindy SAUNDERS, MD as Consulting Physician (Oncology)  CHIEF COMPLAINTS/PURPOSE OF CONSULTATION: lung cancer    Oncology History Overview Note  MPRESSION: 1. Atelectasis/collapse of the right upper lobe with suspected right main stem bronchus endobronchial lesion. Recommend pulmonology referral and consideration for bronchoscopic evaluation and treatment. 2. No evidence of pulmonary embolism to the segmental level.   These results will be called to the ordering clinician or representative by the Radiologist Assistant, and communication documented in the PACS or [employer].     Electronically Signed   By: Maude Naegeli M.D.   On: 12/02/2023 16:54   Malignant neoplasm of right upper lobe of lung (HCC)  12/28/2023 Initial Diagnosis   Malignant neoplasm of right upper lobe of lung (HCC)   12/30/2023 Cancer Staging   Staging form: Lung, AJCC V9 - Clinical: Stage IA1 (cT1a, cN0, cM0) - Signed by Rennie Cindy SAUNDERS, MD on 12/30/2023     HISTORY OF PRESENTING ILLNESS:  Patient ambulating-independently. Accompanied by husband.   Ashlee Miller 73 y.o.  female pleasant patient with history of smoking/asthma quit  stage I squamous cell lung cancer s/p SBRT is here for a follow up.  Discussed the use of AI scribe software for clinical note transcription with the patient, who gave verbal consent to proceed.  History of Present Illness   Ashlee Miller is a 73 year old female with stage one non-small cell lung cancer who presents for follow-up after SBRT and recent CT scan.  She has a history of stage one non-small cell lung cancer and recently completed SBRT in July. The initial cancer was detected in a scan from May.  She experiences a persistent cough that has been ongoing for over a year, starting in September  of the previous year. The cough sometimes causes lightheadedness and produces yellow sputum. She has tried drinking plenty of water and Mullen tea. Mucinet thinned the mucus but did not resolve the cough. Prednisone and an antibiotic were prescribed, which temporarily alleviated the cough.  She was previously on Trelegy for her respiratory symptoms but had to stop due to cost. She resumed Trelegy on her own. She uses a nebulizer once or twice daily, sometimes up to three to four times a day, to manage congestion and wheezing. Significant coughing episodes affect her social activities, such as attending church.  She has a history of prediabetes since her late twenties and takes glipizide. She does not monitor her blood sugar at home. She quit smoking over a year ago and feels better since quitting.  She has a history of throat cancer, which did not require chemotherapy. Her oxygen levels have improved from 89-90% to 98-99%.       Review of Systems  Constitutional:  Negative for chills, diaphoresis, fever, malaise/fatigue and weight loss.  HENT:  Negative for nosebleeds and sore throat.   Eyes:  Negative for double vision.  Respiratory:  Positive for cough. Negative for hemoptysis, sputum production, shortness of breath and wheezing.   Cardiovascular:  Negative for chest pain, palpitations, orthopnea and leg swelling.  Gastrointestinal:  Negative for abdominal pain, blood in stool, constipation, diarrhea, heartburn, melena, nausea and vomiting.  Genitourinary:  Negative for dysuria, frequency and urgency.  Musculoskeletal:  Negative for back pain and joint pain.  Skin: Negative.  Negative for itching and rash.  Neurological:  Negative  for dizziness, tingling, focal weakness, weakness and headaches.  Endo/Heme/Allergies:  Does not bruise/bleed easily.  Psychiatric/Behavioral:  Negative for depression. The patient is not nervous/anxious and does not have insomnia.     MEDICAL HISTORY:  Past  Medical History:  Diagnosis Date   Abnormal growth of clavicle 01/2018   Asthma    allergies frequently. helps to minimize episodes   Atelectasis    Cancer (HCC) 2010   Oral ca. no chemo or radiation, just surgery   Dyspnea    Exposure to TB    as a teenager   Family history of adverse reaction to anesthesia    sister went into cardiac arrest after anesthesia induction, MOM had issues but doesnt know what happened   GERD (gastroesophageal reflux disease)    History of seasonal allergies    HLD (hyperlipidemia)    Hypertension    Hypothyroidism    Neuropathy    Pre-diabetes    Sun allergy     SURGICAL HISTORY: Past Surgical History:  Procedure Laterality Date   BREAST CYST ASPIRATION Right 1999   CARPAL TUNNEL RELEASE Bilateral 2006   COLONOSCOPY W/ POLYPECTOMY     MASS EXCISION Right 02/26/2018   Procedure: EXCISION OF MASS OF RIGHT CLAVICLE AREA;  Surgeon: Gladis Cough, MD;  Location: [facility] ORS;  Service: General;  Laterality: Right;   MULTIPLE TOOTH EXTRACTIONS  01/2018   alll teeth pulled at that time   soft tissue mass removed  2010   removed soft tissues in mouth for positive cancer   VIDEO BRONCHOSCOPY WITH ENDOBRONCHIAL NAVIGATION N/A 12/18/2023   Procedure: VIDEO BRONCHOSCOPY WITH ENDOBRONCHIAL NAVIGATION;  Surgeon: Parris Manna, MD;  Location: [facility] ORS;  Service: Thoracic;  Laterality: N/A;   VIDEO BRONCHOSCOPY WITH ENDOBRONCHIAL ULTRASOUND N/A 12/18/2023   Procedure: BRONCHOSCOPY, WITH EBUS;  Surgeon: Parris Manna, MD;  Location: [facility] ORS;  Service: Thoracic;  Laterality: N/A;    SOCIAL HISTORY: Social History   Socioeconomic History   Marital status: Married    Spouse name: Gaspard,Jc (Spouse)   Number of children: Not on file   Years of education: Not on file   Highest education level: Not on file  Occupational History   Not on file  Tobacco Use   Smoking status: Former    Current packs/day: 0.00    Types: Cigarettes    Quit date: 2010     Years since quitting: 15.9   Smokeless tobacco: Never  Vaping Use   Vaping status: Never Used  Substance and Sexual Activity   Alcohol use: Never   Drug use: Never   Sexual activity: Not on file  Other Topics Concern   Not on file  Social History Narrative   Not on file   Social Drivers of Health   Financial Resource Strain: High Risk (11/30/2023)   Received from [facility]   Overall Financial Resource Strain (CARDIA)    Difficulty of Paying Living Expenses: Hard  Food Insecurity: No Food Insecurity (01/12/2024)   Hunger Vital Sign    Worried About Running Out of Food in the Last Year: Never true    Ran Out of Food in the Last Year: Never true  Transportation Needs: No Transportation Needs (01/12/2024)   PRAPARE - Administrator, Civil Service (Medical): No    Lack of Transportation (Non-Medical): No  Physical Activity: Not on file  Stress: Not on file  Social Connections: Not on file  Intimate Partner Violence: Not At Risk (12/28/2023)  Humiliation, Afraid, Rape, and Kick questionnaire    Fear of Current or Ex-Partner: No    Emotionally Abused: No    Physically Abused: No    Sexually Abused: No    FAMILY HISTORY: Family History  Problem Relation Age of Onset   Breast cancer Sister 50   Lung cancer Brother    Breast cancer Maternal Aunt 57    ALLERGIES:  is allergic to penicillins, ace inhibitors, and fenofibrate.  MEDICATIONS:  Current Outpatient Medications  Medication Sig Dispense Refill   Albuterol Sulfate (PROAIR RESPICLICK) 108 (90 Base) MCG/ACT AEPB Inhale 2 puffs into the lungs every 6 (six) hours as needed.     amLODipine (NORVASC) 2.5 MG tablet Take 2.5 mg by mouth daily.     APPLE CIDER VINEGAR PO Take by mouth at bedtime. Gummy     aspirin EC 81 MG tablet Take 81 mg by mouth at bedtime.     atorvastatin (LIPITOR) 80 MG tablet Take 80 mg by mouth at bedtime.     B Complex-C (B-COMPLEX WITH VITAMIN C) tablet Take 1 tablet by  mouth daily.     chlorthalidone (HYGROTON) 25 MG tablet Take 25 mg by mouth daily.     Cholecalciferol (VITAMIN D3) 125 MCG (5000 UT) TABS Take by mouth daily. (Patient taking differently: Take 1,000 Units by mouth daily.)     ciprofloxacin (CIPRO) 250 MG tablet Take 250 mg by mouth 3 (three) times a week.     cyanocobalamin (VITAMIN B12) 500 MCG tablet Take 500 mcg by mouth daily.     fluticasone (FLONASE) 50 MCG/ACT nasal spray Place 1 spray into both nostrils as needed for allergies or rhinitis.     gabapentin  (NEURONTIN ) 300 MG capsule Take 1,500 mg by mouth at bedtime.      glipiZIDE (GLUCOTROL XL) 10 MG 24 hr tablet Take 10 mg by mouth daily with breakfast.     levocetirizine (XYZAL) 5 MG tablet Take 5 mg by mouth daily.     levothyroxine (SYNTHROID, LEVOTHROID) 88 MCG tablet Take 88 mcg by mouth daily before breakfast.     losartan (COZAAR) 100 MG tablet Take 100 mg by mouth daily.     metoprolol tartrate (LOPRESSOR) 50 MG tablet Take 100 mg by mouth at bedtime.     montelukast (SINGULAIR) 10 MG tablet Take 10 mg by mouth at bedtime.     Multiple Vitamin (MULTIVITAMIN WITH MINERALS) TABS tablet Take 1 tablet by mouth daily. Women's multivitamin     omeprazole (PRILOSEC) 40 MG capsule Take 40 mg by mouth at bedtime.      potassium chloride (K-DUR) 10 MEQ tablet Take 10 mEq by mouth daily. (Patient taking differently: Take 20 mEq by mouth daily.)     predniSONE (DELTASONE) 5 MG tablet Take 5 mg by mouth daily with breakfast.     spironolactone (ALDACTONE) 25 MG tablet Take 25 mg by mouth daily.     vitamin B-12 (CYANOCOBALAMIN) 500 MCG tablet Take 500 mcg by mouth daily.     Zinc 50 MG TABS Take 50 mg by mouth at bedtime.     Fluticasone-Umeclidin-Vilant (TRELEGY ELLIPTA) 100-62.5-25 MCG/ACT AEPB Inhale 1 puff into the lungs daily. 1 each 2   No current facility-administered medications for this visit.    PHYSICAL EXAMINATION:   Vitals:   06/22/24 1252  BP: 118/73  Pulse: 83   Resp: (!) 24  Temp: (!) 96.6 F (35.9 C)  SpO2: 97%   Filed Weights   06/22/24 1252  Weight: 232 lb 1.6 oz (105.3 kg)    Physical Exam Vitals and nursing note reviewed.  HENT:     Head: Normocephalic and atraumatic.     Mouth/Throat:     Pharynx: Oropharynx is clear.  Eyes:     Extraocular Movements: Extraocular movements intact.     Pupils: Pupils are equal, round, and reactive to light.  Cardiovascular:     Rate and Rhythm: Normal rate and regular rhythm.  Pulmonary:     Comments: Decreased breath sounds bilaterally.  Abdominal:     Palpations: Abdomen is soft.  Musculoskeletal:        General: Normal range of motion.     Cervical back: Normal range of motion.  Skin:    General: Skin is warm.  Neurological:     General: No focal deficit present.     Mental Status: She is alert and oriented to person, place, and time.  Psychiatric:        Behavior: Behavior normal.        Judgment: Judgment normal.     LABORATORY DATA:  I have reviewed the data as listed Lab Results  Component Value Date   WBC 5.2 02/10/2024   HGB 12.6 02/10/2024   HCT 38.6 02/10/2024   MCV 85.6 02/10/2024   PLT 221 02/10/2024   No results for input(s): NA, K, CL, CO2, GLUCOSE, BUN, CREATININE, CALCIUM, GFRNONAA, GFRAA, PROT, ALBUMIN, AST, ALT, ALKPHOS, BILITOT, BILIDIR, IBILI in the last 8760 hours.  RADIOGRAPHIC STUDIES: I have personally reviewed the radiological images as listed and agreed with the findings in the report. CT Chest W Contrast Result Date: 06/14/2024 EXAM: CT CHEST WITH CONTRAST 06/09/2024 02:02:06 PM TECHNIQUE: CT of the chest was performed with the administration of 75 mL Omnipaque  300 intravenous contrast. Multiplanar reformatted images are provided for review. Automated exposure control, iterative reconstruction, and/or weight based adjustment of the mA/kV was utilized to reduce the radiation dose to as low as reasonably achievable.  COMPARISON: 12/02/2023 and PET CT from 01/01/2024. CLINICAL HISTORY: Non-small cell lung cancer (NSCLC), non-metastatic, assess treatment response. * Tracking Code: BO * FINDINGS: MEDIASTINUM: Heart and pericardium are unremarkable. Mitral valve calcification. Thoracic aortic, coronary artery, and branch vessel atheromatous vascular calcifications. The central airways are clear, except for findings in the right upper lobe tracheobronchial tree and bronchus intermedius as described under LUNGS AND PLEURA. LYMPH NODES: No mediastinal, hilar or axillary lymphadenopathy. LUNGS AND PLEURA: No focal consolidation or pulmonary edema. No pleural effusion or pneumothorax. Previous occlusive lesion obstructing the right upper lobe bronchus is no longer distinctly seen although there is some airway thickening in the right upper lobe tracheobronchial tree centrally as on image 52 series 3, some of which may be treatment related. Minimal lateral effacement of the bronchus intermedius for example on image 59 series 3. Mild bandlike atelectasis in the right middle lobe and right lower lobe with associated airway thickening anteriorly in the right lower lobe. SOFT TISSUES/BONES: Right serratus anterior lipoma. Mid and Lower thoracic spondylosis. UPPER ABDOMEN: Limited images of the upper abdomen demonstrate cholelithiasis. IMPRESSION: 1. Previous right upper lobe endobronchial obstruction no longer well seen; residual central right upper lobe airway thickening, possibly treatment related. No evidence of metastatic disease. 2. Mild bandlike atelectasis in the right middle and right lower lobes with associated airway thickening. 3. Mid and lower thoracic spondylosis. Electronically signed by: Ryan Salvage MD 06/14/2024 05:11 PM EST RP Workstation: [id number]     Malignant neoplasm of right upper lobe of  lung (HCC) # MAY 14th, 2025- [coughing- d- dimer] Atelectasis/collapse of the right upper lobe with suspected right main  stem bronchus endobronchial lesion.  No evidence of pulmonary embolism to the segmental level. Lung, biopsy, RUL : SQUAMOUS CELL CARCINOMA. [ history of polypmorphous low-grade  adenocarcinoma of the palate, diagnosed in June 2010 ]. # Clinically stage I-given the suspected small size of the endobronchial lesion. JUNE 2025- Hypermetabolic right upper lobe nodule near the right upper lobe bronchus, with a maximum SUV of 9.7. This is compatible with malignancy and associated with complete atelectasis of the right upper lobe;  No findings of metastatic disease.Declined Surgery. S/p SBRT in JULY 2025.  # NOV 20th, 2025-  Previous right upper lobe endobronchial obstruction no longer well seen; residual central right upper lobe airway thickening, possibly treatment related. No evidence of metastatic disease;  Mild bandlike atelectasis in the right middle and right lower lobes with associated airway thickening.  Defer further follow-up/imaging to radiation oncology/pulmonary.  Patient will follow-up with me only as needed  # Cough- History of asthma/COPD-[Dr.A] -worse-likely progressive bronchitis/no obvious evidence of pneumonia.  I recommend using nebs 3-4 times a day; and continue prednisone; print trielegy-recommend reaching out to pulmonary office for further recommendations.  Defer to Dr.A for for further management.    # Oral cancer s/p surgery- [facility] [2010-  polypmorphous low-grade  adenocarcinoma of the palate,]- no chemotherapy or radiation.  Reviewed that current lung cancer is not related to her oral cancer- stable.   # Prediabetes- [on glipizide] metformin- diarrhea.  Monitor closely on steroids.   # DISPOSITION: # Print the trilegy  # follow up as needed- Dr.B  # I reviewed the blood work- with the patient in detail; also reviewed the imaging independently [as summarized above]; and with the patient in detail.      Above plan of care was discussed with patient/family in detail.  My contact  information was given to the patient/family.     Cindy JONELLE Joe, MD 06/22/2024 1:43 PM

## 2024-06-22 NOTE — Assessment & Plan Note (Addendum)
#   MAY 14th, 2025- [coughing- d- dimer] Atelectasis/collapse of the right upper lobe with suspected right main stem bronchus endobronchial lesion.  No evidence of pulmonary embolism to the segmental level. Lung, biopsy, RUL : SQUAMOUS CELL CARCINOMA. [ history of polypmorphous low-grade  adenocarcinoma of the palate, diagnosed in June 2010 ]. # Clinically stage I-given the suspected small size of the endobronchial lesion. JUNE 2025- Hypermetabolic right upper lobe nodule near the right upper lobe bronchus, with a maximum SUV of 9.7. This is compatible with malignancy and associated with complete atelectasis of the right upper lobe;  No findings of metastatic disease.Declined Surgery. S/p SBRT in JULY 2025.  # NOV 20th, 2025-  Previous right upper lobe endobronchial obstruction no longer well seen; residual central right upper lobe airway thickening, possibly treatment related. No evidence of metastatic disease;  Mild bandlike atelectasis in the right middle and right lower lobes with associated airway thickening.  Defer further follow-up/imaging to radiation oncology/pulmonary.  Patient will follow-up with me only as needed  # Cough- History of asthma/COPD-[Dr.A] -worse-likely progressive bronchitis/no obvious evidence of pneumonia.  I recommend using nebs 3-4 times a day; and continue prednisone; print trielegy-recommend reaching out to pulmonary office for further recommendations.  Defer to Dr.A for for further management.    # Oral cancer s/p surgery- [facility] [2010-  polypmorphous low-grade  adenocarcinoma of the palate,]- no chemotherapy or radiation.  Reviewed that current lung cancer is not related to her oral cancer- stable.   # Prediabetes- [on glipizide] metformin- diarrhea.  Monitor closely on steroids.   # DISPOSITION: # Print the trilegy  # follow up as needed- Dr.B  # I reviewed the blood work- with the patient in detail; also reviewed the imaging independently [as summarized above]; and with  the patient in detail.

## 2024-06-23 ENCOUNTER — Encounter: Payer: Self-pay | Admitting: Radiation Oncology

## 2024-06-23 ENCOUNTER — Telehealth: Payer: Self-pay | Admitting: Radiation Oncology

## 2024-06-23 ENCOUNTER — Ambulatory Visit: Admitting: Radiation Oncology

## 2024-06-23 NOTE — Telephone Encounter (Signed)
 Called pt to sched CT - pt confirmed date/time/location - req appt reminder for CT and MD follow up - [facility]

## 2024-06-28 ENCOUNTER — Other Ambulatory Visit: Payer: Self-pay | Admitting: *Deleted

## 2024-06-28 ENCOUNTER — Telehealth: Payer: Self-pay | Admitting: *Deleted

## 2024-06-28 DIAGNOSIS — C3411 Malignant neoplasm of upper lobe, right bronchus or lung: Secondary | ICD-10-CM

## 2024-06-28 MED ORDER — TRELEGY ELLIPTA 100-62.5-25 MCG/ACT IN AEPB: 1.0000 | INHALATION_SPRAY | Freq: Every day | RESPIRATORY_TRACT | 2 refills | Status: AC

## 2024-06-28 NOTE — Telephone Encounter (Signed)
 Patient called and states she received a prescription for Trelegy last week.  States it's too expensive at the pharmacy for 1 month, but states she can afford if we could send a prescription for 3 months.  Message send to Dr. Rennie and his team.  Patient would like the prescription called in to [facility].

## 2024-08-19 ENCOUNTER — Other Ambulatory Visit: Payer: Self-pay | Admitting: Pulmonary Disease

## 2024-08-19 DIAGNOSIS — J189 Pneumonia, unspecified organism: Secondary | ICD-10-CM

## 2024-08-19 DIAGNOSIS — J42 Unspecified chronic bronchitis: Secondary | ICD-10-CM

## 2024-08-19 DIAGNOSIS — J700 Acute pulmonary manifestations due to radiation: Secondary | ICD-10-CM

## 2024-08-24 ENCOUNTER — Ambulatory Visit
Admission: RE | Admit: 2024-08-24 | Discharge: 2024-08-24 | Disposition: A | Source: Ambulatory Visit | Attending: Pulmonary Disease

## 2024-08-24 DIAGNOSIS — J42 Unspecified chronic bronchitis: Secondary | ICD-10-CM

## 2024-08-24 DIAGNOSIS — J189 Pneumonia, unspecified organism: Secondary | ICD-10-CM

## 2024-08-24 DIAGNOSIS — J700 Acute pulmonary manifestations due to radiation: Secondary | ICD-10-CM

## 2024-08-24 MED ORDER — IOHEXOL 300 MG/ML  SOLN
75.0000 mL | Freq: Once | INTRAMUSCULAR | Status: AC | PRN
  Administered 2024-08-24: 75 mL via INTRAVENOUS

## 2024-12-07 ENCOUNTER — Ambulatory Visit

## 2024-12-21 ENCOUNTER — Ambulatory Visit: Admitting: Radiation Oncology
